# Patient Record
Sex: Female | Born: 1970 | Race: White | Hispanic: No | Marital: Married | Smoking: Never smoker
Health system: Southern US, Community
[De-identification: ages and names within clinical notes are randomized; demographics above are authoritative.]

---

## 2020-05-28 ENCOUNTER — Encounter (HOSPITAL_COMMUNITY): Payer: Self-pay | Admitting: Internal Medicine

## 2020-05-29 ENCOUNTER — Encounter (HOSPITAL_COMMUNITY): Payer: Self-pay | Admitting: Internal Medicine

## 2020-05-29 ENCOUNTER — Encounter (HOSPITAL_COMMUNITY)
Admission: EM | Disposition: A | Discharge status: Home / Self Care | Payer: Self-pay | Source: Other Acute Inpatient Hospital | Attending: Internal Medicine

## 2020-05-29 ENCOUNTER — Other Ambulatory Visit: Payer: Self-pay

## 2020-05-29 ENCOUNTER — Inpatient Hospital Stay (HOSPITAL_COMMUNITY): Payer: BC Managed Care – PPO

## 2020-05-29 ENCOUNTER — Inpatient Hospital Stay (HOSPITAL_COMMUNITY): Payer: BC Managed Care – PPO | Admitting: Anesthesiology

## 2020-05-29 ENCOUNTER — Inpatient Hospital Stay (HOSPITAL_COMMUNITY)
Admission: EM | Admit: 2020-05-29 | Discharge: 2020-05-31 | DRG: 419 | Disposition: A | Discharge status: Home / Self Care | Payer: BC Managed Care – PPO | Source: Other Acute Inpatient Hospital | Attending: Internal Medicine | Admitting: Internal Medicine

## 2020-05-29 DIAGNOSIS — K8050 Calculus of bile duct without cholangitis or cholecystitis without obstruction: Secondary | ICD-10-CM

## 2020-05-29 DIAGNOSIS — Z114 Encounter for screening for human immunodeficiency virus [HIV]: Secondary | ICD-10-CM | POA: Diagnosis not present

## 2020-05-29 DIAGNOSIS — K269 Duodenal ulcer, unspecified as acute or chronic, without hemorrhage or perforation: Secondary | ICD-10-CM | POA: Diagnosis present

## 2020-05-29 DIAGNOSIS — K8070 Calculus of gallbladder and bile duct without cholecystitis without obstruction: Principal | ICD-10-CM | POA: Diagnosis present

## 2020-05-29 DIAGNOSIS — K8021 Calculus of gallbladder without cholecystitis with obstruction: Secondary | ICD-10-CM

## 2020-05-29 DIAGNOSIS — Z20822 Contact with and (suspected) exposure to covid-19: Secondary | ICD-10-CM | POA: Diagnosis present

## 2020-05-29 DIAGNOSIS — R1011 Right upper quadrant pain: Secondary | ICD-10-CM | POA: Diagnosis present

## 2020-05-29 HISTORY — PX: REMOVAL OF STONES: SHX5545

## 2020-05-29 HISTORY — PX: SPHINCTEROTOMY: SHX5544

## 2020-05-29 HISTORY — PX: ERCP: SHX5425

## 2020-05-29 LAB — COMPREHENSIVE METABOLIC PANEL
ALT: UNDETERMINED U/L (ref 0–44)
AST: 81 U/L — ABNORMAL HIGH (ref 15–41)
Albumin: 3.7 g/dL (ref 3.5–5.0)
Alkaline Phosphatase: 122 U/L (ref 38–126)
Anion gap: 15 (ref 5–15)
BUN: 9 mg/dL (ref 6–20)
CO2: 18 mmol/L — ABNORMAL LOW (ref 22–32)
Calcium: 8.9 mg/dL (ref 8.9–10.3)
Chloride: 105 mmol/L (ref 98–111)
Creatinine, Ser: 0.8 mg/dL (ref 0.44–1.00)
GFR calc Af Amer: >60 mL/min (ref >60)
GFR calc non Af Amer: >60 mL/min (ref >60)
Glucose, Bld: 70 mg/dL (ref 70–99)
Potassium: 3.6 mmol/L (ref 3.5–5.1)
Sodium: 138 mmol/L (ref 135–145)
Total Bilirubin: UNDETERMINED mg/dL (ref 0.3–1.2)
Total Protein: 7.4 g/dL (ref 6.5–8.1)

## 2020-05-29 LAB — BILIRUBIN, TOTAL: Total Bilirubin: 6.3 mg/dL — ABNORMAL HIGH (ref 0.3–1.2)

## 2020-05-29 LAB — HEPATIC FUNCTION PANEL
ALT: 243 U/L — ABNORMAL HIGH (ref 0–44)
AST: 71 U/L — ABNORMAL HIGH (ref 15–41)
Albumin: 3.5 g/dL (ref 3.5–5.0)
Alkaline Phosphatase: 114 U/L (ref 38–126)
Bilirubin, Direct: 3.9 mg/dL — ABNORMAL HIGH (ref 0.0–0.2)
Indirect Bilirubin: 2.1 mg/dL — ABNORMAL HIGH (ref 0.3–0.9)
Total Bilirubin: 6 mg/dL — ABNORMAL HIGH (ref 0.3–1.2)
Total Protein: 6.8 g/dL (ref 6.5–8.1)

## 2020-05-29 LAB — CBC
HCT: 41.5 % (ref 36.0–46.0)
Hemoglobin: 13.4 g/dL (ref 12.0–15.0)
MCH: 29.5 pg (ref 26.0–34.0)
MCHC: 32.3 g/dL (ref 30.0–36.0)
MCV: 91.2 fL (ref 80.0–100.0)
Platelets: 164 10*3/uL (ref 150–400)
RBC: 4.55 MIL/uL (ref 3.87–5.11)
RDW: 13.2 % (ref 11.5–15.5)
WBC: 6.3 10*3/uL (ref 4.0–10.5)
nRBC: 0 % (ref 0.0–0.2)

## 2020-05-29 LAB — HIV ANTIBODY (ROUTINE TESTING W REFLEX): HIV Screen 4th Generation wRfx: NONREACTIVE

## 2020-05-29 LAB — ALT: ALT: 243 U/L — ABNORMAL HIGH (ref 0–44)

## 2020-05-29 LAB — SURGICAL PCR SCREEN
MRSA, PCR: NEGATIVE
Staphylococcus aureus: NEGATIVE

## 2020-05-29 LAB — LACTIC ACID, PLASMA: Lactic Acid, Venous: 1 mmol/L (ref 0.5–1.9)

## 2020-05-29 LAB — SARS CORONAVIRUS 2 BY RT PCR (HOSPITAL ORDER, PERFORMED IN ~~LOC~~ HOSPITAL LAB): SARS Coronavirus 2: NEGATIVE

## 2020-05-29 SURGERY — ERCP, WITH INTERVENTION IF INDICATED
Anesthesia: General

## 2020-05-29 MED ORDER — LIDOCAINE 2% (20 MG/ML) 5 ML SYRINGE
INTRAMUSCULAR | Status: DC | PRN
  Administered 2020-05-29: 80 mg via INTRAVENOUS

## 2020-05-29 MED ORDER — ACETAMINOPHEN 650 MG RE SUPP: 650.0000 mg | Freq: Four times a day (QID) | RECTAL | Status: DC | PRN

## 2020-05-29 MED ORDER — SODIUM CHLORIDE 0.9 % IV SOLN: INTRAVENOUS | Status: DC

## 2020-05-29 MED ORDER — ROCURONIUM BROMIDE 10 MG/ML (PF) SYRINGE
PREFILLED_SYRINGE | INTRAVENOUS | Status: DC | PRN
  Administered 2020-05-29: 50 mg via INTRAVENOUS

## 2020-05-29 MED ORDER — METRONIDAZOLE IN NACL 5-0.79 MG/ML-% IV SOLN
500.0000 mg | Freq: Three times a day (TID) | INTRAVENOUS | Status: DC
  Administered 2020-05-29 – 2020-05-30 (×5): 500 mg via INTRAVENOUS
  Filled 2020-05-29 (×5): qty 100

## 2020-05-29 MED ORDER — MORPHINE SULFATE (PF) 2 MG/ML IV SOLN
1.0000 mg | INTRAVENOUS | Status: DC | PRN
  Administered 2020-05-29 (×3): 1 mg via INTRAVENOUS
  Filled 2020-05-29 (×3): qty 1

## 2020-05-29 MED ORDER — INDOMETHACIN 50 MG RE SUPP
RECTAL | Status: AC
  Filled 2020-05-29: qty 2

## 2020-05-29 MED ORDER — ONDANSETRON HCL 4 MG/2ML IJ SOLN: 4.0000 mg | Freq: Four times a day (QID) | INTRAMUSCULAR | Status: DC | PRN

## 2020-05-29 MED ORDER — CIPROFLOXACIN IN D5W 400 MG/200ML IV SOLN
INTRAVENOUS | Status: AC
  Filled 2020-05-29: qty 200

## 2020-05-29 MED ORDER — SUGAMMADEX SODIUM 200 MG/2ML IV SOLN
INTRAVENOUS | Status: DC | PRN
  Administered 2020-05-29: 200 mg via INTRAVENOUS

## 2020-05-29 MED ORDER — PANTOPRAZOLE SODIUM 40 MG IV SOLR
40.0000 mg | Freq: Two times a day (BID) | INTRAVENOUS | Status: DC
  Administered 2020-05-29 – 2020-05-30 (×2): 40 mg via INTRAVENOUS
  Filled 2020-05-29 (×2): qty 40

## 2020-05-29 MED ORDER — FENTANYL CITRATE (PF) 250 MCG/5ML IJ SOLN
INTRAMUSCULAR | Status: DC | PRN
  Administered 2020-05-29: 100 ug via INTRAVENOUS

## 2020-05-29 MED ORDER — SODIUM CHLORIDE 0.9 % IV SOLN
2.0000 g | INTRAVENOUS | Status: DC
  Administered 2020-05-29 – 2020-05-30 (×2): 2 g via INTRAVENOUS
  Filled 2020-05-29: qty 2
  Filled 2020-05-29: qty 20
  Filled 2020-05-29: qty 2
  Filled 2020-05-29: qty 20

## 2020-05-29 MED ORDER — DEXAMETHASONE SODIUM PHOSPHATE 10 MG/ML IJ SOLN
INTRAMUSCULAR | Status: DC | PRN
  Administered 2020-05-29: 5 mg via INTRAVENOUS

## 2020-05-29 MED ORDER — PROPOFOL 10 MG/ML IV BOLUS
INTRAVENOUS | Status: DC | PRN
  Administered 2020-05-29: 140 mg via INTRAVENOUS

## 2020-05-29 MED ORDER — SODIUM CHLORIDE 0.9 % IV SOLN
INTRAVENOUS | Status: DC | PRN
  Administered 2020-05-29: 25 mL

## 2020-05-29 MED ORDER — GLUCAGON HCL RDNA (DIAGNOSTIC) 1 MG IJ SOLR
INTRAMUSCULAR | Status: AC
  Filled 2020-05-29: qty 1

## 2020-05-29 MED ORDER — MIDAZOLAM HCL 5 MG/5ML IJ SOLN
INTRAMUSCULAR | Status: DC | PRN
  Administered 2020-05-29: 2 mg via INTRAVENOUS

## 2020-05-29 MED ORDER — ONDANSETRON HCL 4 MG/2ML IJ SOLN
INTRAMUSCULAR | Status: DC | PRN
  Administered 2020-05-29: 4 mg via INTRAVENOUS

## 2020-05-29 MED ORDER — INDOMETHACIN 50 MG RE SUPP
RECTAL | Status: DC | PRN
  Administered 2020-05-29: 100 mg via RECTAL

## 2020-05-29 MED ORDER — ACETAMINOPHEN 325 MG PO TABS
650.0000 mg | ORAL_TABLET | Freq: Four times a day (QID) | ORAL | Status: DC | PRN
  Administered 2020-05-30: 650 mg via ORAL
  Filled 2020-05-29: qty 2

## 2020-05-29 MED ORDER — SODIUM CHLORIDE 0.9 % IV SOLN: INTRAVENOUS | Status: AC

## 2020-05-29 MED ORDER — LACTATED RINGERS IV SOLN: INTRAVENOUS | Status: DC | PRN

## 2020-05-29 NOTE — Brief Op Note (Signed)
05/29/2020  3:29 PM  PATIENT:  Kylie Moyer  49 y.o. female  PRE-OPERATIVE DIAGNOSIS:  CBD sludge, dilated CBD, abnormal LFTs  POST-OPERATIVE DIAGNOSIS:  sphincterotomy, balloon extraction of sludge  PROCEDURE:  Procedure(s): ENDOSCOPIC RETROGRADE CHOLANGIOPANCREATOGRAPHY (ERCP) (N/A) REMOVAL OF STONES SPHINCTEROTOMY  SURGEON:  Surgeon(s) and Role:    Ronnette Juniper, MD - Primary  PHYSICIAN ASSISTANT:   ASSISTANTS: Vista Lawman, RN, Lazaro Arms, Tech  ANESTHESIA:   MAC  EBL:  Minimal  BLOOD ADMINISTERED:none  DRAINS: none  LOCAL MEDICATIONS USED:  NONE  SPECIMEN:  No Specimen  DISPOSITION OF SPECIMEN:  N/A  COUNTS:  YES  TOURNIQUET:  * No tourniquets in log *  DICTATION: .Dragon Dictation  PLAN OF CARE: Admit to inpatient   PATIENT DISPOSITION:  PACU - hemodynamically stable.   Delay start of Pharmacological VTE agent (>24hrs) due to surgical blood loss or risk of bleeding: not applicable

## 2020-05-29 NOTE — Progress Notes (Signed)
Same day note  Patient seen and examined at bedside.  Patient was admitted to the hospital for abdominal pain.  At the time of my evaluation, patient complains of right upper quadrant pain  Physical examination reveals tenderness over the right upper quadrant  Laboratory data and imaging was reviewed  Assessment and Plan.  Abdominal pain secondary to cholelithiasis//with possible choledocholithiasis elevated LFTs and CBD dilatation.  Patient is currently NPO.  Marland Kitchen  Patient will likely need surgical intervention plus ERCP. spoke with GI Dr. Marca Ancona ,will likely go for ERCP tonight.  Spoke with surgery on-call as well.  Tentative cholecystectomy in a.m.  I also spoke with the patient's husband at bedside.  No Charge  Signed,  Tenny Craw, MD [facility]

## 2020-05-29 NOTE — Op Note (Signed)
[facility] Patient Name: Kylie Moyer Procedure Date : 05/29/2020 MRN: [id number] Attending MD: Kerin Salen , MD Date of Birth: 10-Apr-1971 CSN: [id number] Age: 49 Admit Type: Inpatient Procedure:                ERCP Indications:              CBD dilation, sludge in CBD/Cystic duct/gall                            bladder and elevated liver enzymes Providers:                Kerin Salen, MD, Roselie Awkward, RN, Marc Morgans,                            Technician Referring MD:             [facility] Medicines:                Monitored Anesthesia Care Complications:            No immediate complications. Estimated blood loss:                            Minimal. Estimated Blood Loss:     Estimated blood loss was minimal. Procedure:                Pre-Anesthesia Assessment:                           - Prior to the procedure, a History and Physical                            was performed, and patient medications and                            allergies were reviewed. The patient's tolerance of                            previous anesthesia was also reviewed. The risks                            and benefits of the procedure and the sedation                            options and risks were discussed with the patient.                            All questions were answered, and informed consent                            was obtained. Prior Anticoagulants: The patient has                            taken no previous anticoagulant or antiplatelet                            agents. ASA Grade  Assessment: II - A patient with                            mild systemic disease. After reviewing the risks                            and benefits, the patient was deemed in                            satisfactory condition to undergo the procedure.                           After obtaining informed consent, the scope was                            passed under direct vision. Throughout  the                            procedure, the patient's blood pressure, pulse, and                            oxygen saturations were monitored continuously. The                            [id number]-Q180V ([id number]) Olympus Duodensocope was                            introduced through the mouth, and used to inject                            contrast into and used to inject contrast into the                            bile duct. The ERCP was accomplished without                            difficulty. The patient tolerated the procedure                            well. Scope In: Scope Out: Findings:      The scout film was normal. The esophagus was successfully intubated       under direct vision. The scope was advanced to a normal major papilla in       the descending duodenum without detailed examination of the pharynx,       larynx and associated structures, and upper GI tract.      There were many superficial ulcers with pigmentation noted in the       duodenal bulb and duodenum.      Canulation of the bile duct was challenging and the wire advanced into       the pancreatic duct twice. So it was left in the pancreatic duct and the       CBD was canulated using double wire technique which required extreme       bowing of the sphinctertome.  The bile duct was deeply cannulated with the sphincterotome. Contrast       was injected. I personally interpreted the bile duct images. There was       brisk flow of contrast through the ducts. Image quality was excellent.       Contrast extended to the main bile duct. The main bile duct was dilated       but there was no filling defect noted. A straight Roadrunner wire was       passed into the biliary tree.      The wire in the pancreatic duct was then removed.      A 10 mm biliary sphincterotomy was made with a braided sphincterotome       using ERBE electrocautery. There was no post-sphincterotomy bleeding.      The biliary tree was swept with  a 9 and 12 mm balloon starting at the       bifurcation. A large amount of thick sludge was swept from the duct.      The sphincterotomy was further extended by another 1-2 mm. An attempt       was made to repass the 9-12 mm balloon, but due to severe angulation, it       would not pass into the CBD.      Cholangiogram did not show any filling defects at the end of the       procedure.      The patient was given rectal indomethacin prior to start of the       procedure. Impression:               - A biliary sphincterotomy was performed.                           - The biliary tree was swept and sludge was found.                           - Multiple duodenal ulcers(superficial, with                            pigmentation). Moderate Sedation:      Patient did not receive moderate sedation for this procedure, but       instead received monitored anesthesia care. Recommendation:           - Clear liquid diet.                           - Possible cholecystectomy tomorrow(as per surgical                            team). Procedure Code(s):        --- Professional ---                           [phone], Endoscopic retrograde                            cholangiopancreatography (ERCP); with removal of                            calculi/debris from biliary/pancreatic duct(s)  40086, Endoscopic retrograde                            cholangiopancreatography (ERCP); with                            sphincterotomy/papillotomy Diagnosis Code(s):        --- Professional ---                           R74.8, Abnormal levels of other serum enzymes CPT copyright 2019 [employer]. All rights reserved. The codes documented in this report are preliminary and upon coder review may  be revised to meet current compliance requirements. Kerin Salen, MD 05/29/2020 3:28:29 PM This report has been signed electronically. Number of Addenda: 0

## 2020-05-29 NOTE — Anesthesia Preprocedure Evaluation (Addendum)
Anesthesia Evaluation  Patient identified by MRN, date of birth, ID band Patient awake    Reviewed: Allergy & Precautions, NPO status , Patient's Chart, lab work & pertinent test results  History of Anesthesia Complications Negative for: history of anesthetic complications  Airway Mallampati: II  TM Distance: >3 FB Neck ROM: Full    Dental  (+) Missing,    Pulmonary neg pulmonary ROS,    Pulmonary exam normal        Cardiovascular negative cardio ROS Normal cardiovascular exam     Neuro/Psych negative neurological ROS  negative psych ROS   GI/Hepatic Ast 71, ALT 243 Choledocholithiasis   Endo/Other  negative endocrine ROS  Renal/GU negative Renal ROS  negative genitourinary   Musculoskeletal negative musculoskeletal ROS (+)   Abdominal   Peds  Hematology negative hematology ROS (+)   Anesthesia Other Findings Day of surgery medications reviewed with patient.  Reproductive/Obstetrics negative OB ROS                            Anesthesia Physical Anesthesia Plan  ASA: III  Anesthesia Plan: General   Post-op Pain Management:    Induction: Intravenous  PONV Risk Score and Plan: 3 and Treatment may vary due to age or medical condition, Ondansetron, Dexamethasone and Midazolam  Airway Management Planned: Oral ETT  Additional Equipment: None  Intra-op Plan:   Post-operative Plan: Extubation in OR  Informed Consent: I have reviewed the patients History and Physical, chart, labs and discussed the procedure including the risks, benefits and alternatives for the proposed anesthesia with the patient or authorized representative who has indicated his/her understanding and acceptance.     Dental advisory given  Plan Discussed with:   Anesthesia Plan Comments:        Anesthesia Quick Evaluation

## 2020-05-29 NOTE — Progress Notes (Signed)
   05/29/20 0258  Vitals  Temp 98.3 F (36.8 C)  Temp Source Oral  BP (!) 152/78  MAP (mmHg) 100  BP Location Left Arm  BP Method Automatic  Patient Position (if appropriate) Lying  Pulse Rate 99  Pulse Rate Source Dinamap  Resp 18  Level of Consciousness  Level of Consciousness Alert  Oxygen Therapy  SpO2 100 %  O2 Device Room Air  Pain Assessment  Pain Scale 0-10  Pain Score 6  Pain Type Acute pain  Pain Location Abdomen  Pain Orientation Right  Pain Descriptors / Indicators Aching  Pain Frequency Constant  Pain Onset On-going  Patients Stated Pain Goal 2  Pain Intervention(s) MD notified (Comment)  MEWS Score  MEWS Temp 0  MEWS Systolic 0  MEWS Pulse 0  MEWS RR 0  MEWS LOC 0  MEWS Score 0  MEWS Score Color Green  Received patient alert oriented x4 with skin intact and ambulatory with vital signs stable shown above. Patient oriented to room and bed controls, situated in bed with call bell at reach. Will continue to monitor patient with remainder of shift.

## 2020-05-29 NOTE — H&P (Signed)
History and Physical    Kylie Moyer LNL:[id number] DOB: 1971-03-29 DOA: 05/29/2020  PCP: No primary care provider on file. Patient coming from: [facility]  Chief Complaint: Abdominal pain  HPI: Kylie Moyer is a 49 y.o. female with no significant past medical history presented to [facility] ED on 6/15 with complaints of epigastric and right upper quadrant abdominal pain, nausea x 4 days.  Afebrile. Tachycardic. Not hypotensive. No leukocytosis. Lactic acid normal. AST 105, ALT 363, alk phos 167, and T bili 5.7. Lipase 44. CT abdomen revealed evidence of sludge within the gallbladder, cystic duct, and common bile duct with associated CBD dilation.  Also showing evidence of cholelithiasis. Patient was given Zosyn. COVID swab negative.   Patient reports 4-day history of severe cramping pain across her upper abdomen.  Denies associated nausea or vomiting.  Denies diarrhea.  Denies fevers or chills.  Denies history of gallstones.  She has had 2 prior C-sections but does not have any medical problems and currently not on any medications.  No other complaints.  Denies cough, shortness of breath, or chest pain.  Review of Systems:  All systems reviewed and apart from history of presenting illness, are negative.  History reviewed. No pertinent past medical history.  Past Surgical History:  Procedure Laterality Date  . CESAREAN SECTION     x2     reports that she has never smoked. She has never used smokeless tobacco. She reports that she does not drink alcohol and does not use drugs.  Not on File  History reviewed. No pertinent family history.  Prior to Admission medications   Not on File    Physical Exam: Vitals:   05/29/20 0258  BP: (!) 152/78  Pulse: 99  Resp: 18  Temp: 98.3 F (36.8 C)  TempSrc: Oral  SpO2: 100%    Physical Exam Constitutional:      General: She is not in acute distress. HENT:     Head: Normocephalic.     Mouth/Throat:     Mouth: Mucous  membranes are dry.  Eyes:     Extraocular Movements: Extraocular movements intact.  Cardiovascular:     Rate and Rhythm: Normal rate and regular rhythm.     Pulses: Normal pulses.  Pulmonary:     Effort: Pulmonary effort is normal. No respiratory distress.     Breath sounds: Normal breath sounds. No wheezing or rales.  Abdominal:     General: Bowel sounds are normal. There is no distension.     Palpations: Abdomen is soft.     Tenderness: There is no abdominal tenderness. There is no guarding or rebound.  Musculoskeletal:        General: No swelling.     Cervical back: Normal range of motion and neck supple.  Skin:    General: Skin is warm and dry.  Neurological:     Mental Status: She is alert and oriented to person, place, and time.     Labs on Admission: I have personally reviewed following labs and imaging studies  CBC: No results for input(s): WBC, NEUTROABS, HGB, HCT, MCV, PLT in the last 168 hours. Basic Metabolic Panel: No results for input(s): NA, K, CL, CO2, GLUCOSE, BUN, CREATININE, CALCIUM, MG, PHOS in the last 168 hours. GFR: CrCl cannot be calculated (No successful lab value found.). Liver Function Tests: No results for input(s): AST, ALT, ALKPHOS, BILITOT, PROT, ALBUMIN in the last 168 hours. No results for input(s): LIPASE, AMYLASE in the last 168 hours. No results  for input(s): AMMONIA in the last 168 hours. Coagulation Profile: No results for input(s): INR, PROTIME in the last 168 hours. Cardiac Enzymes: No results for input(s): CKTOTAL, CKMB, CKMBINDEX, TROPONINI in the last 168 hours. BNP (last 3 results) No results for input(s): PROBNP in the last 8760 hours. HbA1C: No results for input(s): HGBA1C in the last 72 hours. CBG: No results for input(s): GLUCAP in the last 168 hours. Lipid Profile: No results for input(s): CHOL, HDL, LDLCALC, TRIG, CHOLHDL, LDLDIRECT in the last 72 hours. Thyroid Function Tests: No results for input(s): TSH, T4TOTAL,  FREET4, T3FREE, THYROIDAB in the last 72 hours. Anemia Panel: No results for input(s): VITAMINB12, FOLATE, FERRITIN, TIBC, IRON, RETICCTPCT in the last 72 hours. Urine analysis: No results found for: COLORURINE, APPEARANCEUR, LABSPEC, PHURINE, GLUCOSEU, HGBUR, BILIRUBINUR, KETONESUR, PROTEINUR, UROBILINOGEN, NITRITE, LEUKOCYTESUR  Radiological Exams on Admission: No results found.  Assessment/Plan Principal Problem:   Choledocholithiasis Active Problems:   Encounter for screening for HIV   Choledocholithiasis: Currently afebrile and not tachycardic.  Labs done at outside hospital showing no leukocytosis and normal lactic acid. AST 105, ALT 363, alk phos 167, and T bili 5.7. Lipase 44. CT abdomen revealed evidence of sludge within the gallbladder, cystic duct, and common bile duct with associated CBD dilation.   -Ceftriaxone and Flagyl.  Order blood cultures.  Morphine as needed for pain, antiemetic as needed.  IV fluid hydration and keep n.p.o. Right upper quadrant ultrasound.  Consult GI in a.m. for ERCP.  HIV screening -The patient falls between the ages of 13-64 and should be screened for HIV, therefore HIV testing ordered.  DVT prophylaxis: SCDs at this time Code Status: Full code Family Communication: Husband at bedside. Disposition Plan: Status is: Inpatient  Remains inpatient appropriate because:Ongoing diagnostic testing needed not appropriate for outpatient work up and IV treatments appropriate due to intensity of illness or inability to take PO   Dispo: The patient is from: Home              Anticipated d/c is to: Home              Anticipated d/c date is: 3 days              Patient currently is not medically stable to d/c.  The medical decision making on this patient was of high complexity and the patient is at high risk for clinical deterioration, therefore this is a level 3 visit.  Shela Leff MD [facility]  If 7PM-7AM, please contact  night-coverage [email or web address]  05/29/2020, 4:24 AM

## 2020-05-29 NOTE — Consult Note (Signed)
[facility] Consult  Referring Provider: [facility] Primary Care Physician:  Patient, No Pcp Per Primary Gastroenterologist: Althia Forts  Reason for Consultation: Abnormal LFTs, possible choledocholithiasis  HPI: Kylie Moyer is a 49 y.o. female was [facility] when she presented with epigastric right and left upper quadrant abdominal pain. Patient states she was in her usual state of health until Saturday evening(5 days ago) when she experienced upper abdominal pain both on the right as well as left side which got better on its own.  Her symptoms recurred on Sunday, Monday and on Tuesday it became more severe which prompted her to go to the ED. Patient denies vomiting or fever.  On arrival to the ED she had a CAT scan which showed milk of calcium bile or layering sludge in the fundus, cystic duct and mid and distal common duct to the level of the ampulla. The common duct is mildly prominent.  She has been treated with IV fluids, IV Zosyn, labs from today show a total bilirubin of 6.3, elevated ALT of 243. On presentation to the ED her total bilirubin was 5.7 and ALT was 363, AST was 105 and ALP was 167. Lipase was normal at 44.  Currently patient does not have any abdominal pain but she has also not had anything to eat or drink for the last 2 to 3 days.   History reviewed. No pertinent past medical history.  Past Surgical History:  Procedure Laterality Date  . CESAREAN SECTION     x2    Prior to Admission medications   Not on File    Current Facility-Administered Medications  Medication Dose Route Frequency Provider Last Rate Last Admin  . 0.9 %  sodium chloride infusion   Intravenous Continuous Shela Leff, MD 125 mL/hr at 05/29/20 0700 Rate Verify at 05/29/20 0700  . acetaminophen (TYLENOL) tablet 650 mg  650 mg Oral Q6H PRN Shela Leff, MD       Or  . acetaminophen (TYLENOL) suppository 650 mg  650 mg Rectal Q6H PRN  Shela Leff, MD      . cefTRIAXone (ROCEPHIN) 2 g in sodium chloride 0.9 % 100 mL IVPB  2 g Intravenous Q24H Shela Leff, MD   Stopped at 05/29/20 0556  . metroNIDAZOLE (FLAGYL) IVPB 500 mg  500 mg Intravenous Q8H Shela Leff, MD 100 mL/hr at 05/29/20 0630 500 mg at 05/29/20 0630  . morphine 2 MG/ML injection 1 mg  1 mg Intravenous Q3H PRN Shela Leff, MD   1 mg at 05/29/20 0748  . ondansetron (ZOFRAN) injection 4 mg  4 mg Intravenous Q6H PRN Shela Leff, MD        Allergies as of 05/28/2020  . (Not on File)    History reviewed. No pertinent family history.  Social History   Socioeconomic History  . Marital status: Married    Spouse name: Not on file  . Number of children: Not on file  . Years of education: Not on file  . Highest education level: Not on file  Occupational History  . Not on file  Tobacco Use  . Smoking status: Never Smoker  . Smokeless tobacco: Never Used  Substance and Sexual Activity  . Alcohol use: Never  . Drug use: Never  . Sexual activity: Not on file  Other Topics Concern  . Not on file  Social History Narrative  . Not on file   Social Determinants of Health   Financial Resource Strain:   . Difficulty of Paying  Living Expenses:   Food Insecurity:   . Worried About Programme researcher, broadcasting/film/video in the Last Year:   . Barista in the Last Year:   Transportation Needs:   . Freight forwarder (Medical):   Marland Kitchen Lack of Transportation (Non-Medical):   Physical Activity:   . Days of Exercise per Week:   . Minutes of Exercise per Session:   Stress:   . Feeling of Stress :   Social Connections:   . Frequency of Communication with Friends and Family:   . Frequency of Social Gatherings with Friends and Family:   . Attends Religious Services:   . Active Member of Clubs or Organizations:   . Attends Banker Meetings:   Marland Kitchen Marital Status:   Intimate Partner Violence:   . Fear of Current or Ex-Partner:   .  Emotionally Abused:   Marland Kitchen Physically Abused:   . Sexually Abused:     Review of Systems:  GI: Described in detail in HPI.    Gen: Denies any fever, chills, rigors, night sweats, anorexia, fatigue, weakness, malaise, involuntary weight loss, and sleep disorder CV: Denies chest pain, angina, palpitations, syncope, orthopnea, PND, peripheral edema, and claudication. Resp: Denies dyspnea, cough, sputum, wheezing, coughing up blood. GU : Denies urinary burning, blood in urine, urinary frequency, urinary hesitancy, nocturnal urination, and urinary incontinence. MS: Denies joint pain or swelling.  Denies muscle weakness, cramps, atrophy.  Derm: Denies rash, itching, oral ulcerations, hives, unhealing ulcers.  Psych: Denies depression, anxiety, memory loss, suicidal ideation, hallucinations,  and confusion. Heme: Denies bruising, bleeding, and enlarged lymph nodes. Neuro:  Denies any headaches, dizziness, paresthesias. Endo:  Denies any problems with DM, thyroid, adrenal function.  Physical Exam: Vital signs in last 24 hours: Temp:  [98.1 F (36.7 C)-98.3 F (36.8 C)] 98.1 F (36.7 C) (06/16 0519) Pulse Rate:  [73-99] 73 (06/16 0519) Resp:  [14-18] 14 (06/16 0519) BP: (130-152)/(72-78) 130/72 (06/16 0519) SpO2:  [98 %-100 %] 98 % (06/16 0519) Weight:  [69.3 kg] 69.3 kg (06/16 0534) Last BM Date: 05/28/20  General:   Alert,  Well-developed, well-nourished, pleasant and cooperative in NAD Head:  Normocephalic and atraumatic. Eyes:  Icteric   conjunctiva pink. Ears:  Normal auditory acuity. Nose:  No deformity, discharge,  or lesions. Mouth:  No deformity or lesions.  Oropharynx pink & moist. Neck:  Supple; no masses or thyromegaly. Lungs:  Clear throughout to auscultation.   No wheezes, crackles, or rhonchi. No acute distress. Heart:  Regular rate and rhythm; no murmurs, clicks, rubs,  or gallops. Extremities:  Without clubbing or edema. Neurologic:  Alert and  oriented x4;  grossly  normal neurologically. Skin:  Intact without significant lesions or rashes. Psych:  Alert and cooperative. Normal mood and affect. Abdomen:  Soft, right upper quadrant tenderness, normoactive bowel sounds, no rigidity/guarding/rebound tenderness     Lab Results: Recent Labs    05/29/20 0356  WBC 6.3  HGB 13.4  HCT 41.5  PLT 164   BMET Recent Labs    05/29/20 0356  NA 138  K 3.6  CL 105  CO2 18*  GLUCOSE 70  BUN 9  CREATININE 0.80  CALCIUM 8.9   LFT Recent Labs    05/29/20 0356 05/29/20 0356 05/29/20 0835  PROT 7.4  --   --   ALBUMIN 3.7  --   --   AST 81*  --   --   ALT QUANTITY NOT SUFFICIENT, UNABLE TO PERFORM TEST   < >  243*  ALKPHOS 122  --   --   BILITOT QUANTITY NOT SUFFICIENT, UNABLE TO PERFORM TEST   < > 6.3*   < > = values in this interval not displayed.   PT/INR No results for input(s): LABPROT, INR in the last 72 hours.  Studies/Results: US Abdomen Limited RUQ  Result Date: 05/29/2020 CLINICAL DATA:  Abdominal pain, question of choledocholithiasis EXAM: ULTRASOUND ABDOMEN LIMITED RIGHT UPPER QUADRANT COMPARISON:  None. FINDINGS: Gallbladder: Layering hyperechoic sludge/small stones is seen. No sonographic Murphy sign noted by sonographer. Common bile duct: Diameter: 7 mm Liver: No focal lesion identified. Within normal limits in parenchymal echogenicity. Portal vein is patent on color Doppler imaging with normal direction of blood flow towards the liver. Other: None. IMPRESSION: Cholelithiasis/sludge without evidence of acute cholecystitis. Electronically Signed   By: Jonna Clark M.D.   On: 05/29/2020 05:20    Impression: Gallbladder and CBD sludge with prominent CBD noted on CT Ultrasound showed a CBD of 7 mm and layering sludge/small stones Patient's bilirubin elevated along with ALT  Plan: ERCP today. Keep patient n.p.o., continue normal saline at 125 mL/h, patient started on IV ceftriaxone and IV Flagyl and IV morphine for pain control. The  risks(pancreatitis, bleeding, perforation, anesthesia complication) and the benefits of the procedure were discussed with the patient in details. She understands and verbalizes consent.  Surgical consult for cholecystectomy.   LOS: 0 days   Kerin Salen, MD  05/29/2020, 9:39 AM

## 2020-05-29 NOTE — Anesthesia Procedure Notes (Signed)
Procedure Name: Intubation Date/Time: 05/29/2020 2:12 PM Performed by: Kyung Rudd, CRNA Pre-anesthesia Checklist: Patient identified, Emergency Drugs available, Suction available and Patient being monitored Patient Re-evaluated:Patient Re-evaluated prior to induction Oxygen Delivery Method: Circle system utilized Preoxygenation: Pre-oxygenation with 100% oxygen Induction Type: IV induction Ventilation: Mask ventilation without difficulty Laryngoscope Size: Mac and 3 Grade View: Grade I Tube type: Oral Tube size: 7.0 mm Number of attempts: 1 Airway Equipment and Method: Stylet Placement Confirmation: ETT inserted through vocal cords under direct vision,  positive ETCO2 and breath sounds checked- equal and bilateral Secured at: 20 cm Tube secured with: Tape Dental Injury: Teeth and Oropharynx as per pre-operative assessment

## 2020-05-29 NOTE — Anesthesia Postprocedure Evaluation (Signed)
Anesthesia Post Note  Patient: Lumen Brinlee  Procedure(s) Performed: ENDOSCOPIC RETROGRADE CHOLANGIOPANCREATOGRAPHY (ERCP) (N/A ) REMOVAL OF STONES SPHINCTEROTOMY     Patient location during evaluation: PACU Anesthesia Type: General Level of consciousness: awake and alert and oriented Pain management: pain level controlled Vital Signs Assessment: post-procedure vital signs reviewed and stable Respiratory status: spontaneous breathing, nonlabored ventilation and respiratory function stable Cardiovascular status: blood pressure returned to baseline Postop Assessment: no apparent nausea or vomiting Anesthetic complications: no   No complications documented.  Last Vitals:  Vitals:   05/29/20 1545 05/29/20 1555  BP: (!) 125/59 132/68  Pulse: 86 95  Resp: 12 15  Temp:    SpO2: 97% 97%    Last Pain:  Vitals:   05/29/20 1555  TempSrc:   PainSc: 0-No pain                 Kaylyn Layer

## 2020-05-29 NOTE — Transfer of Care (Signed)
Immediate Anesthesia Transfer of Care Note  Patient: Kylie Moyer  Procedure(s) Performed: ENDOSCOPIC RETROGRADE CHOLANGIOPANCREATOGRAPHY (ERCP) (N/A ) REMOVAL OF STONES SPHINCTEROTOMY  Patient Location: Endoscopy Unit  Anesthesia Type:General  Level of Consciousness: awake, alert  and oriented  Airway & Oxygen Therapy: Patient Spontanous Breathing  Post-op Assessment: Report given to RN, Post -op Vital signs reviewed and stable and Patient moving all extremities X 4  Post vital signs: Reviewed and stable  Last Vitals:  Vitals Value Taken Time  BP 124/69 05/29/20 1535  Temp 36.9 C 05/29/20 1535  Pulse 100 05/29/20 1543  Resp 17 05/29/20 1543  SpO2 97 % 05/29/20 1543  Vitals shown include unvalidated device data.  Last Pain:  Vitals:   05/29/20 1535  TempSrc: Axillary  PainSc: 0-No pain      Patients Stated Pain Goal: 2 (05/29/20 0745)  Complications: No complications documented.

## 2020-05-29 NOTE — Consult Note (Signed)
[facility] Surgery Consult Note  Kylie Moyer 1971/05/22  [id number].    Requesting MD: Flora Lipps Chief Complaint/Reason for Consult: choledocholithiasis HPI:  Patient is an otherwise healthy 49 year old female who was transferred to [facility] from [facility] ED in [place] with 5 day hx of upper abdominal pain. Pain has been intermittent since onset but has progressively worsened with each recurrence. She denies fever, chills, chest pain, SOB, nausea, vomiting, diarrhea. She did have a lighter colored stool yesterday and some dark urine. Workup revealed choledocholithiasis. GI is planning on ERCP today. Past abdominal surgery includes 2 cesarean sections. She does not take any blood thinning medications. She denies alcohol, current tobacco, or illicit drug use. She is not currently working outside of her home. Her husband was at the bedside.   ROS: Review of Systems  Constitutional: Negative for chills and fever.  Respiratory: Negative for shortness of breath.   Cardiovascular: Negative for chest pain and palpitations.  Gastrointestinal: Positive for abdominal pain. Negative for blood in stool, constipation, diarrhea, heartburn, melena, nausea and vomiting.       Light colored stool   Genitourinary: Negative for dysuria, frequency and urgency.       Dark urine   Skin: Negative for itching.  All other systems reviewed and are negative.   History reviewed. No pertinent family history.  History reviewed. No pertinent past medical history.  Past Surgical History:  Procedure Laterality Date  . CESAREAN SECTION     x2    Social History:  reports that she has never smoked. She has never used smokeless tobacco. She reports that she does not drink alcohol and does not use drugs.  Allergies: No Known Allergies  No medications prior to admission.    Blood pressure 130/72, pulse 73, temperature 98.1 F (36.7 C), temperature source Oral, resp. rate 14, height 5' (1.524 m), weight 69.3 kg,  SpO2 98 %. Physical Exam:  General: pleasant, WD, WN white female who is laying in bed in NAD HEENT: Sclera are anicteric.  PERRL.  Ears and nose without any masses or lesions.  Mouth is pink and moist Heart: regular, rate, and rhythm.  Normal s1,s2. No obvious murmurs, gallops, or rubs noted.  Palpable radial and pedal pulses bilaterally Lungs: CTAB, no wheezes, rhonchi, or rales noted.  Respiratory effort nonlabored Abd: soft, ttp in epigastrium and RUQ, ND, +BS, no masses, hernias, or organomegaly MS: all 4 extremities are symmetrical with no cyanosis, clubbing, or edema. Skin: warm and dry with no masses, lesions, or rashes Neuro: Cranial nerves 2-12 grossly intact, sensation grossly intact throughout Psych: A&Ox3 with an appropriate affect.   Results for orders placed or performed during the hospital encounter of 05/29/20 (from the past 48 hour(s))  Surgical pcr screen     Status: None   Collection Time: 05/29/20  3:36 AM   Specimen: Nasal Mucosa; Nasal Swab  Result Value Ref Range   MRSA, PCR NEGATIVE NEGATIVE   Staphylococcus aureus NEGATIVE NEGATIVE    Comment: (NOTE) The Xpert SA Assay (FDA approved for NASAL specimens in patients 65 years of age and older), is one component of a comprehensive surveillance program. It is not intended to diagnose infection nor to guide or monitor treatment. Performed at [facility], [place] [address], [place], [place] [address]   CBC     Status: None   Collection Time: 05/29/20  3:56 AM  Result Value Ref Range   WBC 6.3 4.0 - 10.5 K/uL   RBC  4.55 3.87 - 5.11 MIL/uL   Hemoglobin 13.4 12.0 - 15.0 g/dL   HCT 46.9 36 - 46 %   MCV 91.2 80.0 - 100.0 fL   MCH 29.5 26.0 - 34.0 pg   MCHC 32.3 30.0 - 36.0 g/dL   RDW 62.9 52.8 - 41.3 %   Platelets 164 150 - 400 K/uL   nRBC 0.0 0.0 - 0.2 %    Comment: Performed at [facility], [address] [place], [place] [address]  Comprehensive metabolic panel     Status: Abnormal   Collection  Time: 05/29/20  3:56 AM  Result Value Ref Range   Sodium 138 135 - 145 mmol/L   Potassium 3.6 3.5 - 5.1 mmol/L   Chloride 105 98 - 111 mmol/L   CO2 18 (L) 22 - 32 mmol/L   Glucose, Bld 70 70 - 99 mg/dL    Comment: Glucose reference range applies only to samples taken after fasting for at least 8 hours.   BUN 9 6 - 20 mg/dL   Creatinine, Ser 0.27 0.44 - 1.00 mg/dL   Calcium 8.9 8.9 - 25.3 mg/dL   Total Protein 7.4 6.5 - 8.1 g/dL   Albumin 3.7 3.5 - 5.0 g/dL   AST 81 (H) 15 - 41 U/L   ALT QUANTITY NOT SUFFICIENT, UNABLE TO PERFORM TEST 0 - 44 U/L   Alkaline Phosphatase 122 38 - 126 U/L   Total Bilirubin QUANTITY NOT SUFFICIENT, UNABLE TO PERFORM TEST 0.3 - 1.2 mg/dL   GFR calc non Af Amer >60 >60 mL/min   GFR calc Af Amer >60 >60 mL/min   Anion gap 15 5 - 15    Comment: Performed at [facility], [address], [place], [place] [address]  Lactic acid, plasma     Status: None   Collection Time: 05/29/20  8:35 AM  Result Value Ref Range   Lactic Acid, Venous 1.0 0.5 - 1.9 mmol/L    Comment: Performed at [facility], [address] [place], [place] [address]  ALT     Status: Abnormal   Collection Time: 05/29/20  8:35 AM  Result Value Ref Range   ALT 243 (H) 0 - 44 U/L    Comment: Performed at [facility], [address], [place], [place] [address]  Bilirubin, total     Status: Abnormal   Collection Time: 05/29/20  8:35 AM  Result Value Ref Range   Total Bilirubin 6.3 (H) 0.3 - 1.2 mg/dL    Comment: Performed at [facility], [address], [place], [place] [address]  Hepatic function panel     Status: Abnormal   Collection Time: 05/29/20  8:35 AM  Result Value Ref Range   Total Protein 6.8 6.5 - 8.1 g/dL   Albumin 3.5 3.5 - 5.0 g/dL   AST 71 (H) 15 - 41 U/L   ALT 243 (H) 0 - 44 U/L   Alkaline Phosphatase 114 38 - 126 U/L   Total Bilirubin 6.0 (H) 0.3 - 1.2 mg/dL   Bilirubin, Direct 3.9 (H) 0.0 - 0.2 mg/dL   Indirect Bilirubin 2.1 (H) 0.3 - 0.9  mg/dL    Comment: Performed at [facility], [address], [place], [place] [address]   US Abdomen Limited RUQ  Result Date: 05/29/2020 CLINICAL DATA:  Abdominal pain, question of choledocholithiasis EXAM: ULTRASOUND ABDOMEN LIMITED RIGHT UPPER QUADRANT COMPARISON:  None. FINDINGS: Gallbladder: Layering hyperechoic sludge/small stones is  seen. No sonographic Murphy sign noted by sonographer. Common bile duct: Diameter: 7 mm Liver: No focal lesion identified. Within normal limits in parenchymal echogenicity. Portal vein is patent on color Doppler imaging with normal direction of blood flow towards the liver. Other: None. IMPRESSION: Cholelithiasis/sludge without evidence of acute cholecystitis. Electronically Signed   By: Jonna Clark M.D.   On: 05/29/2020 05:20      Assessment/Plan Choledocholithiasis - CT showed gallbladder and CBD sludge with prominent CBD - US showed CBD 7 mm and layering stones/sludge - Tbili 6.0, LFTs elevated, WBC 6.3 - ERCP today - likely plan lap chole tomorrow as OR scheduling allows - discussed with patient and her husband. Reviewed risks and benefits of proceeding with surgery and encouraged and answered all questions. They wish to proceed. - ok to have CLD after ERCP tonight, NPO after MN  FEN: NPO, IVF VTE: SCDs ID: [email or web address] [id number]>>  Juliet Rude, [facility] 05/29/2020, 11:48 AM Please see Amion for pager number during day hours 7:00am-4:30pm

## 2020-05-30 ENCOUNTER — Encounter (HOSPITAL_COMMUNITY): Payer: Self-pay | Admitting: Internal Medicine

## 2020-05-30 ENCOUNTER — Inpatient Hospital Stay (HOSPITAL_COMMUNITY): Payer: BC Managed Care – PPO | Admitting: Certified Registered"

## 2020-05-30 ENCOUNTER — Encounter (HOSPITAL_COMMUNITY)
Admission: EM | Disposition: A | Discharge status: Home / Self Care | Payer: Self-pay | Source: Other Acute Inpatient Hospital | Attending: Internal Medicine

## 2020-05-30 HISTORY — PX: CHOLECYSTECTOMY: SHX55

## 2020-05-30 LAB — PREGNANCY, URINE: Preg Test, Ur: NEGATIVE

## 2020-05-30 SURGERY — LAPAROSCOPIC CHOLECYSTECTOMY
Anesthesia: General | Site: Abdomen

## 2020-05-30 MED ORDER — FENTANYL CITRATE (PF) 250 MCG/5ML IJ SOLN
INTRAMUSCULAR | Status: AC
  Filled 2020-05-30: qty 5

## 2020-05-30 MED ORDER — LIDOCAINE 2% (20 MG/ML) 5 ML SYRINGE
INTRAMUSCULAR | Status: AC
  Filled 2020-05-30: qty 5

## 2020-05-30 MED ORDER — ONDANSETRON HCL 4 MG/2ML IJ SOLN
INTRAMUSCULAR | Status: DC | PRN
  Administered 2020-05-30: 4 mg via INTRAVENOUS

## 2020-05-30 MED ORDER — CHLORHEXIDINE GLUCONATE 0.12 % MT SOLN
OROMUCOSAL | Status: AC
  Administered 2020-05-30: 15 mL via OROMUCOSAL
  Filled 2020-05-30: qty 15

## 2020-05-30 MED ORDER — ROCURONIUM BROMIDE 10 MG/ML (PF) SYRINGE
PREFILLED_SYRINGE | INTRAVENOUS | Status: DC | PRN
  Administered 2020-05-30: 50 mg via INTRAVENOUS

## 2020-05-30 MED ORDER — PROPOFOL 10 MG/ML IV BOLUS
INTRAVENOUS | Status: DC | PRN
  Administered 2020-05-30: 160 mg via INTRAVENOUS

## 2020-05-30 MED ORDER — HEMOSTATIC AGENTS (NO CHARGE) OPTIME
TOPICAL | Status: DC | PRN
  Administered 2020-05-30: 1 via TOPICAL

## 2020-05-30 MED ORDER — MORPHINE SULFATE (PF) 2 MG/ML IV SOLN
1.0000 mg | INTRAVENOUS | Status: DC | PRN
  Administered 2020-05-30: 2 mg via INTRAVENOUS
  Filled 2020-05-30: qty 1

## 2020-05-30 MED ORDER — BUPIVACAINE HCL (PF) 0.25 % IJ SOLN
INTRAMUSCULAR | Status: AC
  Filled 2020-05-30: qty 30

## 2020-05-30 MED ORDER — PROPOFOL 10 MG/ML IV BOLUS
INTRAVENOUS | Status: AC
  Filled 2020-05-30: qty 20

## 2020-05-30 MED ORDER — DEXAMETHASONE SODIUM PHOSPHATE 10 MG/ML IJ SOLN
INTRAMUSCULAR | Status: DC | PRN
Start: 2020-05-30 — End: 2020-05-30
  Administered 2020-05-30: 5 mg via INTRAVENOUS

## 2020-05-30 MED ORDER — BUPIVACAINE-EPINEPHRINE 0.25% -1:200000 IJ SOLN
INTRAMUSCULAR | Status: DC | PRN
  Administered 2020-05-30: 10 mL

## 2020-05-30 MED ORDER — SODIUM CHLORIDE (PF) 0.9 % IJ SOLN
INTRAMUSCULAR | Status: AC
  Filled 2020-05-30: qty 50

## 2020-05-30 MED ORDER — FENTANYL CITRATE (PF) 100 MCG/2ML IJ SOLN
INTRAMUSCULAR | Status: AC
  Administered 2020-05-30: 50 ug via INTRAVENOUS
  Filled 2020-05-30: qty 2

## 2020-05-30 MED ORDER — FENTANYL CITRATE (PF) 100 MCG/2ML IJ SOLN
25.0000 ug | INTRAMUSCULAR | Status: DC | PRN
  Administered 2020-05-30 (×2): 50 ug via INTRAVENOUS

## 2020-05-30 MED ORDER — ROCURONIUM BROMIDE 10 MG/ML (PF) SYRINGE
PREFILLED_SYRINGE | INTRAVENOUS | Status: AC
  Filled 2020-05-30: qty 10

## 2020-05-30 MED ORDER — BUPIVACAINE-EPINEPHRINE (PF) 0.5% -1:200000 IJ SOLN
INTRAMUSCULAR | Status: DC | PRN
  Administered 2020-05-30 (×2): 15 mL via PERINEURAL

## 2020-05-30 MED ORDER — FENTANYL CITRATE (PF) 100 MCG/2ML IJ SOLN: 50.0000 ug | Freq: Once | INTRAMUSCULAR | Status: AC

## 2020-05-30 MED ORDER — CHLORHEXIDINE GLUCONATE 0.12 % MT SOLN: 15.0000 mL | Freq: Once | OROMUCOSAL | Status: AC

## 2020-05-30 MED ORDER — OXYCODONE HCL 5 MG PO TABS: 5.0000 mg | ORAL_TABLET | Freq: Four times a day (QID) | ORAL | 0 refills | Status: AC | PRN

## 2020-05-30 MED ORDER — LACTATED RINGERS IV SOLN: INTRAVENOUS | Status: DC

## 2020-05-30 MED ORDER — MIDAZOLAM HCL 2 MG/2ML IJ SOLN: 2.0000 mg | Freq: Once | INTRAMUSCULAR | Status: AC

## 2020-05-30 MED ORDER — FENTANYL CITRATE (PF) 100 MCG/2ML IJ SOLN
INTRAMUSCULAR | Status: AC
  Filled 2020-05-30: qty 2

## 2020-05-30 MED ORDER — SODIUM CHLORIDE 0.9 % IV SOLN: INTRAVENOUS | Status: DC

## 2020-05-30 MED ORDER — 0.9 % SODIUM CHLORIDE (POUR BTL) OPTIME
TOPICAL | Status: DC | PRN
  Administered 2020-05-30: 1000 mL

## 2020-05-30 MED ORDER — ONDANSETRON HCL 4 MG/2ML IJ SOLN: 4.0000 mg | Freq: Four times a day (QID) | INTRAMUSCULAR | Status: DC | PRN

## 2020-05-30 MED ORDER — SPY AGENT GREEN - (INDOCYANINE FOR INJECTION)
INTRAMUSCULAR | Status: DC | PRN
  Administered 2020-05-30: 2 mL via TOPICAL

## 2020-05-30 MED ORDER — ORAL CARE MOUTH RINSE: 15.0000 mL | Freq: Once | OROMUCOSAL | Status: AC

## 2020-05-30 MED ORDER — MIDAZOLAM HCL 5 MG/5ML IJ SOLN: INTRAMUSCULAR | Status: DC | PRN

## 2020-05-30 MED ORDER — OXYCODONE HCL 5 MG PO TABS
5.0000 mg | ORAL_TABLET | ORAL | Status: DC | PRN
  Administered 2020-05-30 – 2020-05-31 (×4): 5 mg via ORAL
  Filled 2020-05-30 (×4): qty 1

## 2020-05-30 MED ORDER — SUGAMMADEX SODIUM 200 MG/2ML IV SOLN
INTRAVENOUS | Status: DC | PRN
  Administered 2020-05-30: 140 mg via INTRAVENOUS

## 2020-05-30 MED ORDER — MIDAZOLAM HCL 2 MG/2ML IJ SOLN
INTRAMUSCULAR | Status: AC
  Administered 2020-05-30: 2 mg via INTRAVENOUS
  Filled 2020-05-30: qty 2

## 2020-05-30 MED ORDER — OXYCODONE HCL 5 MG/5ML PO SOLN: 5.0000 mg | Freq: Once | ORAL | Status: DC | PRN

## 2020-05-30 MED ORDER — FENTANYL CITRATE (PF) 100 MCG/2ML IJ SOLN
INTRAMUSCULAR | Status: DC | PRN
  Administered 2020-05-30: 150 ug via INTRAVENOUS
  Administered 2020-05-30: 100 ug via INTRAVENOUS

## 2020-05-30 MED ORDER — SODIUM CHLORIDE 0.9 % IR SOLN
Status: DC | PRN
  Administered 2020-05-30: 1000 mL

## 2020-05-30 MED ORDER — OXYCODONE HCL 5 MG PO TABS: 5.0000 mg | ORAL_TABLET | Freq: Once | ORAL | Status: DC | PRN

## 2020-05-30 MED ORDER — LIDOCAINE 2% (20 MG/ML) 5 ML SYRINGE
INTRAMUSCULAR | Status: DC | PRN
  Administered 2020-05-30: 50 mg via INTRAVENOUS

## 2020-05-30 SURGICAL SUPPLY — 43 items
APPLICATOR ARISTA FLEXITIP XL (MISCELLANEOUS) ×3 IMPLANT
APPLIER CLIP 5 13 M/L LIGAMAX5 (MISCELLANEOUS) ×3
BLADE CLIPPER SURG (BLADE) IMPLANT
CANISTER SUCT 3000ML PPV (MISCELLANEOUS) ×3 IMPLANT
CHLORAPREP W/TINT 26 (MISCELLANEOUS) ×3 IMPLANT
CLIP APPLIE 5 13 M/L LIGAMAX5 (MISCELLANEOUS) ×2 IMPLANT
CLOSURE STERI-STRIP 1/4X4 (GAUZE/BANDAGES/DRESSINGS) ×3 IMPLANT
COVER MAYO STAND STRL (DRAPES) IMPLANT
COVER SURGICAL LIGHT HANDLE (MISCELLANEOUS) ×3 IMPLANT
COVER WAND RF STERILE (DRAPES) ×3 IMPLANT
DERMABOND ADVANCED (GAUZE/BANDAGES/DRESSINGS) ×1
DERMABOND ADVANCED .7 DNX12 (GAUZE/BANDAGES/DRESSINGS) ×2 IMPLANT
DRAPE C-ARM 42X120 X-RAY (DRAPES) IMPLANT
ELECT REM PT RETURN 9FT ADLT (ELECTROSURGICAL) ×3
ELECTRODE REM PT RTRN 9FT ADLT (ELECTROSURGICAL) ×2 IMPLANT
GLOVE BIO SURGEON STRL SZ7 (GLOVE) ×3 IMPLANT
GLOVE BIOGEL PI IND STRL 7.5 (GLOVE) ×2 IMPLANT
GLOVE BIOGEL PI INDICATOR 7.5 (GLOVE) ×1
GOWN STRL REUS W/ TWL LRG LVL3 (GOWN DISPOSABLE) ×6 IMPLANT
GOWN STRL REUS W/TWL LRG LVL3 (GOWN DISPOSABLE) ×3
GRASPER SUT TROCAR 14GX15 (MISCELLANEOUS) ×3 IMPLANT
HEMOSTAT ARISTA ABSORB 3G PWDR (HEMOSTASIS) ×3 IMPLANT
KIT BASIN OR (CUSTOM PROCEDURE TRAY) ×3 IMPLANT
KIT TURNOVER KIT B (KITS) ×3 IMPLANT
NS IRRIG 1000ML POUR BTL (IV SOLUTION) ×3 IMPLANT
PAD ARMBOARD 7.5X6 YLW CONV (MISCELLANEOUS) ×3 IMPLANT
POUCH RETRIEVAL ECOSAC 10 (ENDOMECHANICALS) ×2 IMPLANT
POUCH RETRIEVAL ECOSAC 10MM (ENDOMECHANICALS) ×1
SCISSORS LAP 5X35 DISP (ENDOMECHANICALS) ×3 IMPLANT
SET CHOLANGIOGRAPH 5 50 .035 (SET/KITS/TRAYS/PACK) IMPLANT
SET IRRIG TUBING LAPAROSCOPIC (IRRIGATION / IRRIGATOR) ×3 IMPLANT
SET TUBE SMOKE EVAC HIGH FLOW (TUBING) ×3 IMPLANT
SLEEVE ENDOPATH XCEL 5M (ENDOMECHANICALS) ×6 IMPLANT
SPECIMEN JAR SMALL (MISCELLANEOUS) ×3 IMPLANT
STRIP CLOSURE SKIN 1/2X4 (GAUZE/BANDAGES/DRESSINGS) ×3 IMPLANT
SUT MNCRL AB 4-0 PS2 18 (SUTURE) ×3 IMPLANT
SUT VICRYL 0 UR6 27IN ABS (SUTURE) ×6 IMPLANT
TOWEL GREEN STERILE (TOWEL DISPOSABLE) ×3 IMPLANT
TOWEL GREEN STERILE FF (TOWEL DISPOSABLE) ×3 IMPLANT
TRAY LAPAROSCOPIC MC (CUSTOM PROCEDURE TRAY) ×3 IMPLANT
TROCAR XCEL BLUNT TIP 100MML (ENDOMECHANICALS) ×3 IMPLANT
TROCAR XCEL NON-BLD 5MMX100MML (ENDOMECHANICALS) ×3 IMPLANT
WATER STERILE IRR 1000ML POUR (IV SOLUTION) ×3 IMPLANT

## 2020-05-30 NOTE — Progress Notes (Signed)
Subjective: Patient was seen in her room as she was trying to walk around in the hallways. Denies abdominal pain but feels gassy. Was able to tolerate clear liquids, had some juice yesterday evening without pain, nausea or vomiting.  Objective: Vital signs in last 24 hours: Temp:  [98.1 F (36.7 C)-98.7 F (37.1 C)] 98.7 F (37.1 C) (06/17 0434) Pulse Rate:  [67-103] 67 (06/17 0434) Resp:  [12-19] 17 (06/17 0434) BP: (111-153)/(59-81) 111/74 (06/17 0434) SpO2:  [96 %-99 %] 99 % (06/17 0434) Weight change:  Last BM Date: 05/28/20  PE: Not in distress GENERAL: Mild icterus ABDOMEN: Nondistended EXTREMITIES: No deformity  Lab Results: Results for orders placed or performed during the hospital encounter of 05/29/20 (from the past 48 hour(s))  Surgical pcr screen     Status: None   Collection Time: 05/29/20  3:36 AM   Specimen: Nasal Mucosa; Nasal Swab  Result Value Ref Range   MRSA, PCR NEGATIVE NEGATIVE   Staphylococcus aureus NEGATIVE NEGATIVE    Comment: (NOTE) The Xpert SA Assay (FDA approved for NASAL specimens in patients 36 years of age and older), is one component of a comprehensive surveillance program. It is not intended to diagnose infection nor to guide or monitor treatment. Performed at [facility], [address] [place], [place] [address]   HIV Antibody (routine testing w rflx)     Status: None   Collection Time: 05/29/20  3:56 AM  Result Value Ref Range   HIV Screen 4th Generation wRfx Non Reactive Non Reactive    Comment: Performed at [facility], [address], [place], [place] [address]  CBC     Status: None   Collection Time: 05/29/20  3:56 AM  Result Value Ref Range   WBC 6.3 4.0 - 10.5 K/uL   RBC 4.55 3.87 - 5.11 MIL/uL   Hemoglobin 13.4 12.0 - 15.0 g/dL   HCT 46.9 36 - 46 %   MCV 91.2 80.0 - 100.0 fL   MCH 29.5 26.0 - 34.0 pg   MCHC 32.3 30.0 - 36.0 g/dL   RDW 62.9 52.8 - 41.3 %   Platelets 164 150 - 400 K/uL   nRBC 0.0 0.0 -  0.2 %    Comment: Performed at [facility], [address] [place], [place] [address]  Comprehensive metabolic panel     Status: Abnormal   Collection Time: 05/29/20  3:56 AM  Result Value Ref Range   Sodium 138 135 - 145 mmol/L   Potassium 3.6 3.5 - 5.1 mmol/L   Chloride 105 98 - 111 mmol/L   CO2 18 (L) 22 - 32 mmol/L   Glucose, Bld 70 70 - 99 mg/dL    Comment: Glucose reference range applies only to samples taken after fasting for at least 8 hours.   BUN 9 6 - 20 mg/dL   Creatinine, Ser 0.27 0.44 - 1.00 mg/dL   Calcium 8.9 8.9 - 25.3 mg/dL   Total Protein 7.4 6.5 - 8.1 g/dL   Albumin 3.7 3.5 - 5.0 g/dL   AST 81 (H) 15 - 41 U/L   ALT QUANTITY NOT SUFFICIENT, UNABLE TO PERFORM TEST 0 - 44 U/L   Alkaline Phosphatase 122 38 - 126 U/L   Total Bilirubin QUANTITY NOT SUFFICIENT, UNABLE TO PERFORM TEST 0.3 - 1.2 mg/dL   GFR calc non Af Amer >60 >60 mL/min   GFR calc Af Amer >60 >60 mL/min   Anion gap 15 5 -  15    Comment: Performed at [facility], [address] [place], [place] [address]  Lactic acid, plasma     Status: None   Collection Time: 05/29/20  8:35 AM  Result Value Ref Range   Lactic Acid, Venous 1.0 0.5 - 1.9 mmol/L    Comment: Performed at [facility], [address] [place], [place] [address]  ALT     Status: Abnormal   Collection Time: 05/29/20  8:35 AM  Result Value Ref Range   ALT 243 (H) 0 - 44 U/L    Comment: Performed at [facility], [address], [place], [place] [address]  Bilirubin, total     Status: Abnormal   Collection Time: 05/29/20  8:35 AM  Result Value Ref Range   Total Bilirubin 6.3 (H) 0.3 - 1.2 mg/dL    Comment: Performed at [facility], [address] [place], [place] [address]  Hepatic function panel     Status: Abnormal   Collection Time: 05/29/20  8:35 AM  Result Value Ref Range   Total Protein 6.8 6.5 - 8.1 g/dL   Albumin 3.5 3.5 - 5.0 g/dL   AST 71 (H) 15 - 41 U/L   ALT 243 (H) 0 - 44 U/L    Alkaline Phosphatase 114 38 - 126 U/L   Total Bilirubin 6.0 (H) 0.3 - 1.2 mg/dL   Bilirubin, Direct 3.9 (H) 0.0 - 0.2 mg/dL   Indirect Bilirubin 2.1 (H) 0.3 - 0.9 mg/dL    Comment: Performed at [facility], [address] [place], [place] [address]  SARS Coronavirus 2 by RT PCR (hospital order, performed in [facility] hospital lab) Nasopharyngeal Nasopharyngeal Swab     Status: None   Collection Time: 05/29/20 10:30 AM   Specimen: Nasopharyngeal Swab  Result Value Ref Range   SARS Coronavirus 2 NEGATIVE NEGATIVE    Comment: (NOTE) SARS-CoV-2 target nucleic acids are NOT DETECTED.  The SARS-CoV-2 RNA is generally detectable in upper and lower respiratory specimens during the acute phase of infection. The lowest concentration of SARS-CoV-2 viral copies this assay can detect is 250 copies / mL. A negative result does not preclude SARS-CoV-2 infection and should not be used as the sole basis for treatment or other patient management decisions.  A negative result may occur with improper specimen collection / handling, submission of specimen other than nasopharyngeal swab, presence of viral mutation(s) within the areas targeted by this assay, and inadequate number of viral copies (<250 copies / mL). A negative result must be combined with clinical observations, patient history, and epidemiological information.  Fact Sheet for Patients:   BoilerBrush.com.cy  Fact Sheet for Healthcare Providers: https://pope.com/  This test is not yet approved or  cleared by the [place] FDA and has been authorized for detection and/or diagnosis of SARS-CoV-2 by FDA under an Emergency Use Authorization (EUA).  This EUA will remain in effect (meaning this test can be used) for the duration of the COVID-19 declaration under Section 564(b)(1) of the Act, 21 U.S.C. section 360bbb-3(b)(1), unless the authorization is terminated or revoked  sooner.  Performed at [facility], [address] [place], [place] [address]     Studies/Results: DG ERCP BILIARY & PANCREATIC DUCTS  Result Date: 05/29/2020 CLINICAL DATA:  49 year old female with a history of choledocholithiasis EXAM: ERCP TECHNIQUE: Multiple spot images obtained with the fluoroscopic device and submitted for interpretation post-procedure. FLUOROSCOPY TIME:  Fluoroscopy Time:  4 minutes 43 seconds COMPARISON:  None. FINDINGS: Limited intraoperative fluoroscopic spot images during ERCP. Initial image demonstrates the endoscope projecting over the upper abdomen. Subsequently there is cannulation of the ampulla and retrograde infusion of contrast into the extrahepatic ductal system. No obvious filling defects of the CBD and the common hepatic duct Incomplete contrast opacification of the cystic duct, of uncertain significance. IMPRESSION: Limited images during ERCP demonstrates no evidence of filling defects within the common bile duct or common hepatic duct. There is vague contrast column in the region of the cystic duct with incomplete opacification, of uncertain significance. Please refer to the dictated operative report for full details of intraoperative findings and procedure. Electronically Signed   By: Corrie Mckusick D.O.   On: 05/29/2020 16:56   US Abdomen Limited RUQ  Result Date: 05/29/2020 CLINICAL DATA:  Abdominal pain, question of choledocholithiasis EXAM: ULTRASOUND ABDOMEN LIMITED RIGHT UPPER QUADRANT COMPARISON:  None. FINDINGS: Gallbladder: Layering hyperechoic sludge/small stones is seen. No sonographic Murphy sign noted by sonographer. Common bile duct: Diameter: 7 mm Liver: No focal lesion identified. Within normal limits in parenchymal echogenicity. Portal vein is patent on color Doppler imaging with normal direction of blood flow towards the liver. Other: None. IMPRESSION: Cholelithiasis/sludge without evidence of acute cholecystitis. Electronically Signed    By: Prudencio Pair M.D.   On: 05/29/2020 05:20    Medications: I have reviewed the patient's current medications.  Assessment: Sludge noted in gallbladder, cystic duct, CBD-causing biliary colic and elevated liver enzymes Status post ERCP with sphincterotomy and removal of thick sludge from CBD  Plan: Cholecystectomy being planned for today. We will sign off from GI standpoint, please recall if needed.  Ronnette Juniper, MD 05/30/2020, 8:18 AM

## 2020-05-30 NOTE — Anesthesia Procedure Notes (Signed)
Procedure Name: Intubation Date/Time: 05/30/2020 12:17 PM Performed by: Rosiland Oz, CRNA Pre-anesthesia Checklist: Patient identified, Emergency Drugs available, Suction available and Patient being monitored Patient Re-evaluated:Patient Re-evaluated prior to induction Oxygen Delivery Method: Circle System Utilized Preoxygenation: Pre-oxygenation with 100% oxygen Induction Type: IV induction Ventilation: Mask ventilation without difficulty Laryngoscope Size: Miller and 2 Grade View: Grade I Tube type: Oral Tube size: 7.0 mm Number of attempts: 1 Airway Equipment and Method: Stylet Placement Confirmation: ETT inserted through vocal cords under direct vision,  positive ETCO2 and breath sounds checked- equal and bilateral Secured at: 22 cm Tube secured with: Tape Dental Injury: Teeth and Oropharynx as per pre-operative assessment

## 2020-05-30 NOTE — Op Note (Signed)
Preoperative diagnosis: resolved choledocholithiasis Postoperative diagnosis: same as above Procedure: laparoscopic cholecystectomy, ICG dye injection Surgeon: Dr Harden Mo Asst Myrtie Soman Anes: general  EBL: 30 cc Complications: none Drains none Specimens gallbladder and contents to pathology Sponge and needle count correct dispo to recovery stable.  Indications: 35 yof s/p ercp for choledocholithiasis presents for laparoscopic cholecystectomy. We discussed surgery and recovery.    Procedure: After informed consent was obtained the patient was taken to the operating room. She was given antibiotics. SCDs were placed. She was placed under general anesthesia without complication. She was prepped and draped in the standard sterile surgical fashion. A surgical timeout was then performed.  I infiltrated Marcaine below the umbilicus. I made a vertical incision and grasped the fascia. I entered the fascia and entered then entered the peritoneum bluntly. There was no evidence of an entry injury. I placed 2-0 Vicryl pursestring suture through the fascia. I then inserted a Hassan trocar and insufflated the abdomen to 15 mmHg pressure. I then inserted 3 further 5 mm trocars in epigastrium and right upper quadrant under direct vision without complication.  I then retracted it cephalad and lateral. I did use the ICG dye as well as anatomical landmarks to identify the common bile duct and obtain the critical view of safety.  I then clipped the cystic duct 3 times. The clips completely went across the duct and the duct was viable. I treated the artery in a similar fashion. I then removed the gallbladder from the liver bed.  I placed this in a retrieval bag and removed it from the umbilicus. I then obtained hemostasis on the liver. I did place arista in the gallbladder fossa. I then evacuated all the fluid. I removed my Hassan trocar and tied my pursestring down. I placed  additional 0 Vicryl sutures using the suture passer device to completely obliterate this defect. I then desufflated the abdomen and removed all the trocars. I then closed these with 4-0 Monocryl and glue. She tolerated this well was extubated and transferred to recovery stable.

## 2020-05-30 NOTE — Anesthesia Procedure Notes (Signed)
Anesthesia Regional Block: TAP block   Pre-Anesthetic Checklist: ,, timeout performed, Correct Patient, Correct Site, Correct Laterality, Correct Procedure, Correct Position, site marked, Risks and benefits discussed,  Surgical consent,  Pre-op evaluation,  At surgeon's request and post-op pain management  Laterality: Right  Prep: chloraprep       Needles:  Injection technique: Single-shot  Needle Type: Echogenic Needle     Needle Length: 9cm  Needle Gauge: 21     Additional Needles:   Narrative:  Start time: 05/30/2020 11:10 AM End time: 05/30/2020 11:17 AM Injection made incrementally with aspirations every 5 mL.  Performed by: Personally  Anesthesiologist: Achille Rich, MD  Additional Notes: Pt tolerated the procedure well.

## 2020-05-30 NOTE — Discharge Instructions (Signed)
[facility]  Please arrive at least 30 min before your appointment to complete your check in paperwork.  If you are unable to arrive 30 min prior to your appointment time we may have to cancel or reschedule you. LAPAROSCOPIC SURGERY: POST OP INSTRUCTIONS Always review your discharge instruction sheet given to you by the facility where your surgery was performed. IF YOU HAVE DISABILITY OR FAMILY LEAVE FORMS, YOU MUST BRING THEM TO THE OFFICE FOR PROCESSING.   DO NOT GIVE THEM TO YOUR DOCTOR.  PAIN CONTROL  1. First take acetaminophen (Tylenol) AND/or ibuprofen (Advil) to control your pain after surgery.  Follow directions on package.  Taking acetaminophen (Tylenol) and/or ibuprofen (Advil) regularly after surgery will help to control your pain and lower the amount of prescription pain medication you may need.  You should not take more than 4,000 mg (4 grams) of acetaminophen (Tylenol) in 24 hours.  You should not take ibuprofen (Advil), aleve, motrin, naprosyn or other NSAIDS if you have a history of stomach ulcers or chronic kidney disease.  2. A prescription for pain medication may be given to you upon discharge.  Take your pain medication as prescribed, if you still have uncontrolled pain after taking acetaminophen (Tylenol) or ibuprofen (Advil). 3. Use ice packs to help control pain. 4. If you need a refill on your pain medication, please contact your pharmacy.  They will contact our office to request authorization. Prescriptions will not be filled after 5pm or on week-ends.  HOME MEDICATIONS 5. Take your usually prescribed medications unless otherwise directed.  DIET 6. You should follow a light diet the first few days after arrival home.  Be sure to include lots of fluids daily. Avoid fatty, fried foods.   CONSTIPATION 7. It is common to experience some constipation after surgery and if you are taking pain medication.  Increasing fluid intake and taking a stool  softener (such as Colace) will usually help or prevent this problem from occurring.  A mild laxative (Milk of Magnesia or Miralax) should be taken according to package instructions if there are no bowel movements after 48 hours.  WOUND/INCISION CARE 8. Most patients will experience some swelling and bruising in the area of the incisions.  Ice packs will help.  Swelling and bruising can take several days to resolve.  9. Unless discharge instructions indicate otherwise, follow guidelines below  a. STERI-STRIPS - you may remove your outer bandages 48 hours after surgery, and you may shower at that time.  You have steri-strips (small skin tapes) in place directly over the incision.  These strips should be left on the skin for 7-10 days.   b. DERMABOND/SKIN GLUE - you may shower in 24 hours.  The glue will flake off over the next 2-3 weeks. 10. Any sutures or staples will be removed at the office during your follow-up visit.  ACTIVITIES 11. You may resume regular (light) daily activities beginning the next day--such as daily self-care, walking, climbing stairs--gradually increasing activities as tolerated.  You may have sexual intercourse when it is comfortable.  Refrain from any heavy lifting or straining until approved by your doctor. a. You may drive when you are no longer taking prescription pain medication, you can comfortably wear a seatbelt, and you can safely maneuver your car and apply brakes.  FOLLOW-UP 12. You should see your doctor in the office for a follow-up appointment approximately 2-3 weeks after your surgery.  You should have been given your post-op/follow-up appointment when   your surgery was scheduled.  If you did not receive a post-op/follow-up appointment, make sure that you call for this appointment within a day or two after you arrive home to insure a convenient appointment time.   WHEN TO CALL YOUR DOCTOR: 1. Fever over 101.0 2. Inability to urinate 3. Continued bleeding from  incision. 4. Increased pain, redness, or drainage from the incision. 5. Increasing abdominal pain  The clinic staff is available to answer your questions during regular business hours.  Please don't hesitate to call and ask to speak to one of the nurses for clinical concerns.  If you have a medical emergency, go to the nearest emergency room or call 911.  A surgeon from [facility] is always on call at the hospital. [address], [address], [place], [place]  [address] ? [address] [address], [place],    [address] [phone] ? [phone] ? FAX [phone]  .........   Managing Your Pain After Surgery Without Opioids    Thank you for participating in our program to help patients manage their pain after surgery without opioids. This is part of our effort to provide you with the best care possible, without exposing you or your family to the risk that opioids pose.  What pain can I expect after surgery? You can expect to have some pain after surgery. This is normal. The pain is typically worse the day after surgery, and quickly begins to get better. Many studies have found that many patients are able to manage their pain after surgery with Over-the-Counter (OTC) medications such as Tylenol and Motrin. If you have a condition that does not allow you to take Tylenol or Motrin, notify your surgical team.  How will I manage my pain? The best strategy for controlling your pain after surgery is around the clock pain control with Tylenol (acetaminophen) and Motrin (ibuprofen or Advil). Alternating these medications with each other allows you to maximize your pain control. In addition to Tylenol and Motrin, you can use heating pads or ice packs on your incisions to help reduce your pain.  How will I alternate your regular strength over-the-counter pain medication? You will take a dose of pain medication every three hours. ; Start by taking 650 mg of Tylenol (2 pills of 325  mg) ; 3 hours later take 600 mg of Motrin (3 pills of 200 mg) ; 3 hours after taking the Motrin take 650 mg of Tylenol ; 3 hours after that take 600 mg of Motrin.   - 1 -  See example - if your first dose of Tylenol is at 12:00 PM   12:00 PM Tylenol 650 mg (2 pills of 325 mg)  3:00 PM Motrin 600 mg (3 pills of 200 mg)  6:00 PM Tylenol 650 mg (2 pills of 325 mg)  9:00 PM Motrin 600 mg (3 pills of 200 mg)  Continue alternating every 3 hours   We recommend that you follow this schedule around-the-clock for at least 3 days after surgery, or until you feel that it is no longer needed. Use the table on the last page of this handout to keep track of the medications you are taking. Important: Do not take more than 3000mg of Tylenol or 3200mg of Motrin in a 24-hour period. Do not take ibuprofen/Motrin if you have a history of bleeding stomach ulcers, severe kidney disease, &/or actively taking a blood thinner  What if I still have pain? If you have pain that is not   controlled with the over-the-counter pain medications (Tylenol and Motrin or Advil) you might have what we call "breakthrough" pain. You will receive a prescription for a small amount of an opioid pain medication such as Oxycodone, Tramadol, or Tylenol with Codeine. Use these opioid pills in the first 24 hours after surgery if you have breakthrough pain. Do not take more than 1 pill every 4-6 hours.  If you still have uncontrolled pain after using all opioid pills, don't hesitate to call our staff using the number provided. We will help make sure you are managing your pain in the best way possible, and if necessary, we can provide a prescription for additional pain medication.   Day 1    Time  Name of Medication Number of pills taken  Amount of Acetaminophen  Pain Level   Comments  AM PM       AM PM       AM PM       AM PM       AM PM       AM PM       AM PM       AM PM       Total Daily amount of Acetaminophen Do not  take more than  3,000 mg per day      Day 2    Time  Name of Medication Number of pills taken  Amount of Acetaminophen  Pain Level   Comments  AM PM       AM PM       AM PM       AM PM       AM PM       AM PM       AM PM       AM PM       Total Daily amount of Acetaminophen Do not take more than  3,000 mg per day      Day 3    Time  Name of Medication Number of pills taken  Amount of Acetaminophen  Pain Level   Comments  AM PM       AM PM       AM PM       AM PM          AM PM       AM PM       AM PM       AM PM       Total Daily amount of Acetaminophen Do not take more than  3,000 mg per day      Day 4    Time  Name of Medication Number of pills taken  Amount of Acetaminophen  Pain Level   Comments  AM PM       AM PM       AM PM       AM PM       AM PM       AM PM       AM PM       AM PM       Total Daily amount of Acetaminophen Do not take more than  3,000 mg per day      Day 5    Time  Name of Medication Number of pills taken  Amount of Acetaminophen  Pain Level   Comments  AM PM       AM PM       AM   PM       AM PM       AM PM       AM PM       AM PM       AM PM       Total Daily amount of Acetaminophen Do not take more than  3,000 mg per day       Day 6    Time  Name of Medication Number of pills taken  Amount of Acetaminophen  Pain Level  Comments  AM PM       AM PM       AM PM       AM PM       AM PM       AM PM       AM PM       AM PM       Total Daily amount of Acetaminophen Do not take more than  3,000 mg per day      Day 7    Time  Name of Medication Number of pills taken  Amount of Acetaminophen  Pain Level   Comments  AM PM       AM PM       AM PM       AM PM       AM PM       AM PM       AM PM       AM PM       Total Daily amount of Acetaminophen Do not take more than  3,000 mg per day        For additional information about how and where to safely dispose of unused  opioid medications - https://www.morepowerfulnc.org  Disclaimer: This document contains information and/or instructional materials adapted from [facility] for the typical patient with your condition. It does not replace medical advice from your health care provider because your experience may differ from that of the typical patient. Talk to your health care provider if you have any questions about this document, your condition or your treatment plan. Adapted from [facility]   

## 2020-05-30 NOTE — Progress Notes (Addendum)
PROGRESS NOTE  Kylie Moyer VFI:[id number] DOB: 1971-01-08 DOA: 05/29/2020 PCP: Patient, No Pcp Per   LOS: 1 day   Brief narrative: As per HPI,  Kylie Moyer is a 49 y.o. female with no significant past medical history presented to [facility] ED on 6/15 with complaints of epigastric and right upper quadrant abdominal pain, nausea x 4 days. She was afebrile but tachycardic. Not hypotensive. There was no leukocytosis. Lactic acid was normal. AST 105, ALT 363, alk phos 167, and T bili 5.7. Lipase 44. CT abdomen revealed evidence of sludge within the gallbladder, cystic duct, and common bile duct with associated CBD dilation.  Also showing evidence of cholelithiasis. Patient was given IV Zosyn. COVID swab negative.   Patient was then transferred to our hospital for further evaluation and treatment.  Assessment/Plan:  Principal Problem:   Choledocholithiasis Active Problems:   Encounter for screening for HIV  Abdominal pain secondary to cholelithiasis//with CBD sludge elevated LFTs and CBD dilatation.  Seen by  GI Dr. Therisa Doyne and underwent ERCP with sphincterectomy and removal of thick sludge from CBD on 05/29/2020.   Surgery for possible cholecystectomy today.   DVT prophylaxis: SCDs Start: 05/29/20 0331   Code Status: Full code  Family Communication: Spoke with the patient's husband at bedside  Status is: Inpatient  Remains inpatient appropriate because:Inpatient level of care appropriate due to severity of illness and Need for cholecystectomy   Dispo:  Patient From: Home  Planned Disposition: Home  Expected discharge date: 05/31/20  Medically stable for discharge: No   Consultants:  GI   General surgery  Procedures: Sphincterectomy and removal of thick sludge from CBD on 05/29/2020.  Antibiotics:  . Rocephin and Flagyl 6/16>   Subjective: Today, patient was seen and examined at bedside.  States that her abdominal pain has improved.  No nausea, vomiting fever chills.   Status post ERCP on 05/29/2020.   Objective: Vitals:   05/29/20 2050 05/30/20 0434  BP: 126/81 111/74  Pulse: 82 67  Resp: 17 17  Temp: 98.1 F (36.7 C) 98.7 F (37.1 C)  SpO2: 97% 99%    Intake/Output Summary (Last 24 hours) at 05/30/2020 0826 Last data filed at 05/29/2020 1700 Gross per 24 hour  Intake 250 ml  Output --  Net 250 ml   Filed Weights   05/29/20 0534  Weight: 69.3 kg   Body mass index is 29.84 kg/m.   Physical Exam: GENERAL: Patient is alert awake and oriented. Not in obvious distress. HENT: No scleral pallor or icterus. Pupils equally reactive to light. Oral mucosa is moist NECK: is supple, no gross swelling noted. CHEST: Clear to auscultation. No crackles or wheezes.  Diminished breath sounds bilaterally. CVS: S1 and S2 heard, no murmur. Regular rate and rhythm.  ABDOMEN: Soft, deep tenderness on palpation.  Bowel sounds are present. EXTREMITIES: No edema. CNS: Cranial nerves are intact. No focal motor deficits. SKIN: warm and dry without rashes.  Data Review: I have personally reviewed the following laboratory data and studies,  CBC: Recent Labs  Lab 05/29/20 0356  WBC 6.3  HGB 13.4  HCT 41.5  MCV 91.2  PLT 416   Basic Metabolic Panel: Recent Labs  Lab 05/29/20 0356  NA 138  K 3.6  CL 105  CO2 18*  GLUCOSE 70  BUN 9  CREATININE 0.80  CALCIUM 8.9   Liver Function Tests: Recent Labs  Lab 05/29/20 0356 05/29/20 0835  AST 81* 71*  ALT QUANTITY NOT SUFFICIENT, UNABLE TO PERFORM TEST 243*  243*  ALKPHOS 122 114  BILITOT QUANTITY NOT SUFFICIENT, UNABLE TO PERFORM TEST 6.0*  6.3*  PROT 7.4 6.8  ALBUMIN 3.7 3.5   No results for input(s): LIPASE, AMYLASE in the last 168 hours. No results for input(s): AMMONIA in the last 168 hours. Cardiac Enzymes: No results for input(s): CKTOTAL, CKMB, CKMBINDEX, TROPONINI in the last 168 hours. BNP (last 3 results) No results for input(s): BNP in the last 8760 hours.  ProBNP (last 3  results) No results for input(s): PROBNP in the last 8760 hours.  CBG: No results for input(s): GLUCAP in the last 168 hours. Recent Results (from the past 240 hour(s))  Surgical pcr screen     Status: None   Collection Time: 05/29/20  3:36 AM   Specimen: Nasal Mucosa; Nasal Swab  Result Value Ref Range Status   MRSA, PCR NEGATIVE NEGATIVE Final   Staphylococcus aureus NEGATIVE NEGATIVE Final    Comment: (NOTE) The Xpert SA Assay (FDA approved for NASAL specimens in patients 68 years of age and older), is one component of a comprehensive surveillance program. It is not intended to diagnose infection nor to guide or monitor treatment. Performed at [facility], [place] [address], [place], [place] [address]   SARS Coronavirus 2 by RT PCR (hospital order, performed in [facility] hospital lab) Nasopharyngeal Nasopharyngeal Swab     Status: None   Collection Time: 05/29/20 10:30 AM   Specimen: Nasopharyngeal Swab  Result Value Ref Range Status   SARS Coronavirus 2 NEGATIVE NEGATIVE Final    Comment: (NOTE) SARS-CoV-2 target nucleic acids are NOT DETECTED.  The SARS-CoV-2 RNA is generally detectable in upper and lower respiratory specimens during the acute phase of infection. The lowest concentration of SARS-CoV-2 viral copies this assay can detect is 250 copies / mL. A negative result does not preclude SARS-CoV-2 infection and should not be used as the sole basis for treatment or other patient management decisions.  A negative result may occur with improper specimen collection / handling, submission of specimen other than nasopharyngeal swab, presence of viral mutation(s) within the areas targeted by this assay, and inadequate number of viral copies (<250 copies / mL). A negative result must be combined with clinical observations, patient history, and epidemiological information.  Fact Sheet for Patients:   StrictlyIdeas.no  Fact Sheet for Healthcare  Providers: BankingDealers.co.za  This test is not yet approved or  cleared by the [place] FDA and has been authorized for detection and/or diagnosis of SARS-CoV-2 by FDA under an Emergency Use Authorization (EUA).  This EUA will remain in effect (meaning this test can be used) for the duration of the COVID-19 declaration under Section 564(b)(1) of the Act, 21 U.S.C. section 360bbb-3(b)(1), unless the authorization is terminated or revoked sooner.  Performed at [facility], [place] [address], [place], [place] [address]      Studies: DG ERCP BILIARY & PANCREATIC DUCTS  Result Date: 05/29/2020 CLINICAL DATA:  49 year old female with a history of choledocholithiasis EXAM: ERCP TECHNIQUE: Multiple spot images obtained with the fluoroscopic device and submitted for interpretation post-procedure. FLUOROSCOPY TIME:  Fluoroscopy Time:  4 minutes 43 seconds COMPARISON:  None. FINDINGS: Limited intraoperative fluoroscopic spot images during ERCP. Initial image demonstrates the endoscope projecting over the upper abdomen. Subsequently there is cannulation of the ampulla and retrograde infusion of contrast into the extrahepatic ductal system. No obvious filling defects of the CBD and the common hepatic duct Incomplete contrast opacification of the cystic duct, of  uncertain significance. IMPRESSION: Limited images during ERCP demonstrates no evidence of filling defects within the common bile duct or common hepatic duct. There is vague contrast column in the region of the cystic duct with incomplete opacification, of uncertain significance. Please refer to the dictated operative report for full details of intraoperative findings and procedure. Electronically Signed   By: Corrie Mckusick D.O.   On: 05/29/2020 16:56   US Abdomen Limited RUQ  Result Date: 05/29/2020 CLINICAL DATA:  Abdominal pain, question of choledocholithiasis EXAM: ULTRASOUND ABDOMEN LIMITED RIGHT UPPER QUADRANT  COMPARISON:  None. FINDINGS: Gallbladder: Layering hyperechoic sludge/small stones is seen. No sonographic Murphy sign noted by sonographer. Common bile duct: Diameter: 7 mm Liver: No focal lesion identified. Within normal limits in parenchymal echogenicity. Portal vein is patent on color Doppler imaging with normal direction of blood flow towards the liver. Other: None. IMPRESSION: Cholelithiasis/sludge without evidence of acute cholecystitis. Electronically Signed   By: Prudencio Pair M.D.   On: 05/29/2020 05:20      Flora Lipps, MD  [facility] 05/30/2020

## 2020-05-30 NOTE — Progress Notes (Signed)
1 Day Post-Op   Subjective/Chief Complaint: Doing fine after ercp, no pain   Objective: Vital signs in last 24 hours: Temp:  [98.1 F (36.7 C)-98.7 F (37.1 C)] 98.7 F (37.1 C) (06/17 0434) Pulse Rate:  [67-103] 67 (06/17 0434) Resp:  [12-19] 17 (06/17 0434) BP: (111-153)/(59-81) 111/74 (06/17 0434) SpO2:  [96 %-99 %] 99 % (06/17 0434) Last BM Date: 05/28/20  Intake/Output from previous day: 06/16 0701 - 06/17 0700 In: 250 [I.V.:250] Out: -  Intake/Output this shift: No intake/output data recorded.  GI: nontender  Lab Results:  Recent Labs    05/29/20 0356  WBC 6.3  HGB 13.4  HCT 41.5  PLT 164   BMET Recent Labs    05/29/20 0356  NA 138  K 3.6  CL 105  CO2 18*  GLUCOSE 70  BUN 9  CREATININE 0.80  CALCIUM 8.9   PT/INR No results for input(s): LABPROT, INR in the last 72 hours. ABG No results for input(s): PHART, HCO3 in the last 72 hours.  Invalid input(s): PCO2, PO2  Studies/Results: DG ERCP BILIARY & PANCREATIC DUCTS  Result Date: 05/29/2020 CLINICAL DATA:  49 year old female with a history of choledocholithiasis EXAM: ERCP TECHNIQUE: Multiple spot images obtained with the fluoroscopic device and submitted for interpretation post-procedure. FLUOROSCOPY TIME:  Fluoroscopy Time:  4 minutes 43 seconds COMPARISON:  None. FINDINGS: Limited intraoperative fluoroscopic spot images during ERCP. Initial image demonstrates the endoscope projecting over the upper abdomen. Subsequently there is cannulation of the ampulla and retrograde infusion of contrast into the extrahepatic ductal system. No obvious filling defects of the CBD and the common hepatic duct Incomplete contrast opacification of the cystic duct, of uncertain significance. IMPRESSION: Limited images during ERCP demonstrates no evidence of filling defects within the common bile duct or common hepatic duct. There is vague contrast column in the region of the cystic duct with incomplete opacification, of  uncertain significance. Please refer to the dictated operative report for full details of intraoperative findings and procedure. Electronically Signed   By: Corrie Mckusick D.O.   On: 05/29/2020 16:56   US Abdomen Limited RUQ  Result Date: 05/29/2020 CLINICAL DATA:  Abdominal pain, question of choledocholithiasis EXAM: ULTRASOUND ABDOMEN LIMITED RIGHT UPPER QUADRANT COMPARISON:  None. FINDINGS: Gallbladder: Layering hyperechoic sludge/small stones is seen. No sonographic Murphy sign noted by sonographer. Common bile duct: Diameter: 7 mm Liver: No focal lesion identified. Within normal limits in parenchymal echogenicity. Portal vein is patent on color Doppler imaging with normal direction of blood flow towards the liver. Other: None. IMPRESSION: Cholelithiasis/sludge without evidence of acute cholecystitis. Electronically Signed   By: Prudencio Pair M.D.   On: 05/29/2020 05:20    Anti-infectives: Anti-infectives (From admission, onward)   Start     Dose/Rate Route Frequency Ordered Stop   05/29/20 0430  cefTRIAXone (ROCEPHIN) 2 g in sodium chloride 0.9 % 100 mL IVPB     Discontinue     2 g 200 mL/hr over 30 Minutes Intravenous Every 24 hours 05/29/20 0417     05/29/20 0430  metroNIDAZOLE (FLAGYL) IVPB 500 mg     Discontinue     500 mg 100 mL/hr over 60 Minutes Intravenous Every 8 hours 05/29/20 0417        Assessment/Plan: Resolved choledocholithiais Lap chole today I discussed the procedure in detail.  We discussed the risks and benefits of a laparoscopic cholecystectomy and possible cholangiogram including, but not limited to bleeding, infection, injury to surrounding structures such as the intestine or  liver, bile leak, retained gallstones, need to convert to an open procedure, prolonged diarrhea, blood clots such as  DVT, common bile duct injury, anesthesia risks, and possible need for additional procedures.  The likelihood of improvement in symptoms and return to the patient's normal status is  good. We discussed the typical post-operative recovery course.   Emelia Loron 05/30/2020

## 2020-05-30 NOTE — Progress Notes (Signed)
Pt arrived to 6N25. Pt alert and awake. Report received from [place], [place]. Pt complain of 8/10 pain. PRN morphine given. See assessment. Will continue to monitor pt.

## 2020-05-30 NOTE — Progress Notes (Signed)
Spoke with Ilean Skill, RN, floor nurse about urine pregnancy test prior to surgery. RN stated she would collect sample this am.

## 2020-05-30 NOTE — Anesthesia Preprocedure Evaluation (Signed)
Anesthesia Evaluation  Patient identified by MRN, date of birth, ID band Patient awake    Reviewed: Allergy & Precautions, H&P , NPO status , Patient's Chart, lab work & pertinent test results  Airway Mallampati: II   Neck ROM: full    Dental   Pulmonary neg pulmonary ROS,    breath sounds clear to auscultation       Cardiovascular negative cardio ROS   Rhythm:regular Rate:Normal     Neuro/Psych    GI/Hepatic gallstones   Endo/Other    Renal/GU      Musculoskeletal   Abdominal   Peds  Hematology   Anesthesia Other Findings   Reproductive/Obstetrics                             Anesthesia Physical Anesthesia Plan  ASA: I  Anesthesia Plan: General   Post-op Pain Management:  Regional for Post-op pain   Induction: Intravenous  PONV Risk Score and Plan: 3 and Ondansetron, Dexamethasone, Midazolam and Treatment may vary due to age or medical condition  Airway Management Planned:   Additional Equipment:   Intra-op Plan:   Post-operative Plan: Extubation in OR  Informed Consent: I have reviewed the patients History and Physical, chart, labs and discussed the procedure including the risks, benefits and alternatives for the proposed anesthesia with the patient or authorized representative who has indicated his/her understanding and acceptance.       Plan Discussed with: CRNA, Anesthesiologist and Surgeon  Anesthesia Plan Comments:         Anesthesia Quick Evaluation

## 2020-05-30 NOTE — Transfer of Care (Signed)
Immediate Anesthesia Transfer of Care Note  Patient: Kylie Moyer  Procedure(s) Performed: LAPAROSCOPIC CHOLECYSTECTOMY (N/A Abdomen)  Patient Location: PACU  Anesthesia Type:General  Level of Consciousness: drowsy and patient cooperative  Airway & Oxygen Therapy: Patient Spontanous Breathing  Post-op Assessment: Report given to RN and Post -op Vital signs reviewed and stable  Post vital signs: Reviewed and stable  Last Vitals:  Vitals Value Taken Time  BP 120/68 05/30/20 1312  Temp 36.7 C 05/30/20 1312  Pulse 71 05/30/20 1316  Resp 18 05/30/20 1316  SpO2 99 % 05/30/20 1316  Vitals shown include unvalidated device data.  Last Pain:  Vitals:   05/30/20 0818  TempSrc:   PainSc: 0-No pain      Patients Stated Pain Goal: 2 (05/29/20 0745)  Complications: No complications documented.

## 2020-05-30 NOTE — Anesthesia Procedure Notes (Signed)
Anesthesia Regional Block: TAP block   Pre-Anesthetic Checklist: ,, timeout performed, Correct Patient, Correct Site, Correct Laterality, Correct Procedure, Correct Position, site marked, Risks and benefits discussed,  Surgical consent,  Pre-op evaluation,  At surgeon's request and post-op pain management  Laterality: Left  Prep: chloraprep       Needles:  Injection technique: Single-shot  Needle Type: Echogenic Needle     Needle Length: 9cm  Needle Gauge: 21     Additional Needles:   Narrative:  Start time: 05/30/2020 11:09 AM End time: 05/30/2020 11:09 AM Injection made incrementally with aspirations every 5 mL.  Performed by: Personally  Anesthesiologist: Achille Rich, MD  Additional Notes: Pt tolerated the procedure well.

## 2020-05-31 LAB — SURGICAL PATHOLOGY

## 2020-05-31 NOTE — Discharge Summary (Signed)
Physician Discharge Summary  Patient ID: Kylie Moyer MRN: [id number] DOB/AGE: September 23, 1971 49 y.o.  Admit date: 05/29/2020 Discharge date: 05/31/2020  Admission Diagnoses: Choledocholithiasis Biliary colic  Discharge Diagnoses:  Principal Problem:   Choledocholithiasis Active Problems:   Encounter for screening for HIV   Discharged Condition: good  Hospital Course: 67 yof transfer with choledocholithiasis. Underwent uneventful ercp followed the next day by laparoscopic cholecystectomy. She is doing well today. Tolerating diet, pain controlled, ambulating and will be discharged home  Consults: None  Significant Diagnostic Studies: none  Treatments: surgery: 1. Ercp 2. Lap chole  Discharge Exam: Blood pressure 109/65, pulse 68, temperature 98.3 F (36.8 C), resp. rate 16, height 5' (1.524 m), weight 69.3 kg, last menstrual period 05/01/2020, SpO2 100 %, not currently breastfeeding. GI: soft approp tender incisions clean  Disposition: Discharge disposition: 01-Home or Self Care        Allergies as of 05/31/2020   No Known Allergies     Medication List    TAKE these medications   oxyCODONE 5 MG immediate release tablet Commonly known as: Oxy IR/ROXICODONE Take 1 tablet (5 mg total) by mouth every 6 (six) hours as needed for moderate pain, severe pain or breakthrough pain.       Follow-up Information    [facility]. Schedule an appointment as soon as possible for a visit.   Contact information: [address] [place] [address] [phone]       Surgery, [place] Follow up on 06/18/2020.   Specialty: General Surgery Why: 9:45am, arrive by 9:15am for paperwork and check in process.  Please bring a photo ID and your insurance card Contact information: [address] [place] [address] [phone]               Signed: Emelia Loron 05/31/2020, 9:01 AM

## 2020-05-31 NOTE — Progress Notes (Signed)
Kylie Moyer to be D/C'd  per MD order. Discussed with the patient and all questions fully answered.  VSS, Skin clean, dry and intact without evidence of skin break down, no evidence of skin tears noted.  IV catheter discontinued intact. Site without signs and symptoms of complications. Dressing and pressure applied.  An After Visit Summary was printed and given to the patient. Patient received prescription.  D/c education completed with patient/family including follow up instructions, medication list, d/c activities limitations if indicated, with other d/c instructions as indicated by MD - patient able to verbalize understanding, all questions fully answered.   Patient instructed to return to ED, call 911, or call MD for any changes in condition.   Patient to be escorted via WC, and D/C home via private auto.

## 2020-05-31 NOTE — Anesthesia Postprocedure Evaluation (Signed)
Anesthesia Post Note  Patient: Kylie Moyer  Procedure(s) Performed: LAPAROSCOPIC CHOLECYSTECTOMY (N/A Abdomen)     Patient location during evaluation: PACU Anesthesia Type: General Level of consciousness: awake and alert Pain management: pain level controlled Vital Signs Assessment: post-procedure vital signs reviewed and stable Respiratory status: spontaneous breathing, nonlabored ventilation, respiratory function stable and patient connected to nasal cannula oxygen Cardiovascular status: blood pressure returned to baseline and stable Postop Assessment: no apparent nausea or vomiting Anesthetic complications: no   No complications documented.  Last Vitals:  Vitals:   05/31/20 0205 05/31/20 0500  BP: 102/63 109/65  Pulse: 66 68  Resp: 17 16  Temp: 36.8 C 36.8 C  SpO2: 99% 100%    Last Pain:  Vitals:   05/31/20 0908  TempSrc:   PainSc: 6                  Isobel Eisenhuth S

## 2020-05-31 NOTE — Progress Notes (Signed)
1 Day Post-Op   Subjective/Chief Complaint: Doing fine, no issues this am, ambulating, tol diet   Objective: Vital signs in last 24 hours: Temp:  [98 F (36.7 C)-98.5 F (36.9 C)] 98.3 F (36.8 C) (06/18 0500) Pulse Rate:  [66-94] 68 (06/18 0500) Resp:  [12-19] 16 (06/18 0500) BP: (102-147)/(59-91) 109/65 (06/18 0500) SpO2:  [96 %-100 %] 100 % (06/18 0500) Weight:  [69.3 kg] 69.3 kg (06/17 1052) Last BM Date:  (PTA)  Intake/Output from previous day: 06/17 0701 - 06/18 0700 In: 3782.9 [P.O.:720; I.V.:2562.9; IV Piggyback:500] Out: 30 [Blood:30] Intake/Output this shift: No intake/output data recorded.  GI: soft approp tender incisions clean  Lab Results:  Recent Labs    05/29/20 0356  WBC 6.3  HGB 13.4  HCT 41.5  PLT 164   BMET Recent Labs    05/29/20 0356  NA 138  K 3.6  CL 105  CO2 18*  GLUCOSE 70  BUN 9  CREATININE 0.80  CALCIUM 8.9   PT/INR No results for input(s): LABPROT, INR in the last 72 hours. ABG No results for input(s): PHART, HCO3 in the last 72 hours.  Invalid input(s): PCO2, PO2  Studies/Results: DG ERCP BILIARY & PANCREATIC DUCTS  Result Date: 05/29/2020 CLINICAL DATA:  49 year old female with a history of choledocholithiasis EXAM: ERCP TECHNIQUE: Multiple spot images obtained with the fluoroscopic device and submitted for interpretation post-procedure. FLUOROSCOPY TIME:  Fluoroscopy Time:  4 minutes 43 seconds COMPARISON:  None. FINDINGS: Limited intraoperative fluoroscopic spot images during ERCP. Initial image demonstrates the endoscope projecting over the upper abdomen. Subsequently there is cannulation of the ampulla and retrograde infusion of contrast into the extrahepatic ductal system. No obvious filling defects of the CBD and the common hepatic duct Incomplete contrast opacification of the cystic duct, of uncertain significance. IMPRESSION: Limited images during ERCP demonstrates no evidence of filling defects within the common bile  duct or common hepatic duct. There is vague contrast column in the region of the cystic duct with incomplete opacification, of uncertain significance. Please refer to the dictated operative report for full details of intraoperative findings and procedure. Electronically Signed   By: Gilmer Mor D.O.   On: 05/29/2020 16:56    Anti-infectives: Anti-infectives (From admission, onward)   Start     Dose/Rate Route Frequency Ordered Stop   05/29/20 0430  cefTRIAXone (ROCEPHIN) 2 g in sodium chloride 0.9 % 100 mL IVPB  Status:  Discontinued        2 g 200 mL/hr over 30 Minutes Intravenous Every 24 hours 05/29/20 0417 05/30/20 1408   05/29/20 0430  metroNIDAZOLE (FLAGYL) IVPB 500 mg  Status:  Discontinued        500 mg 100 mL/hr over 60 Minutes Intravenous Every 8 hours 05/29/20 0417 05/30/20 1408      Assessment/Plan: POD 1 lap chole S/p ercp for choledocholithiasis -home today  Kylie Moyer 05/31/2020

## 2020-06-03 LAB — CULTURE, BLOOD (ROUTINE X 2)
Culture: NO GROWTH
Culture: NO GROWTH

## 2020-11-02 IMAGING — US US ABDOMEN LIMITED
1 series · 14 of 25 positions shown · non-contrast
Comparison: None.

CLINICAL DATA: Abdominal pain, question of choledocholithiasis

EXAM:
ULTRASOUND ABDOMEN LIMITED RIGHT UPPER QUADRANT

[Series 1: us abdomen limited ruq · 14 of 54 slices shown]
[im 1/54]
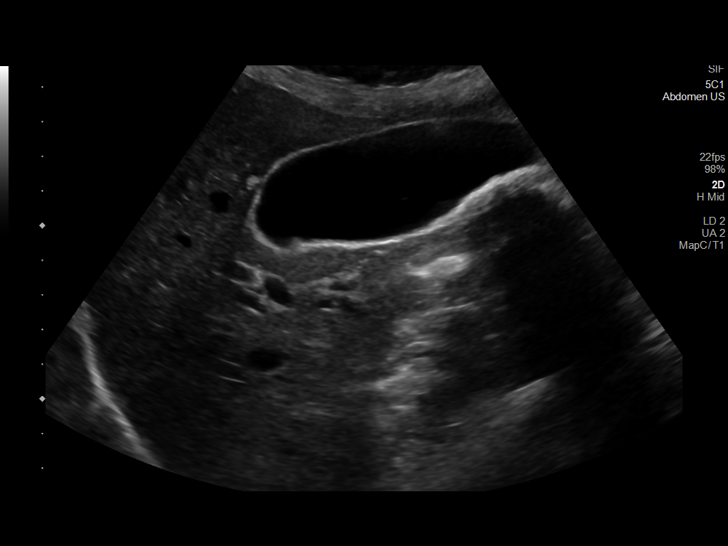
[im 5/54]
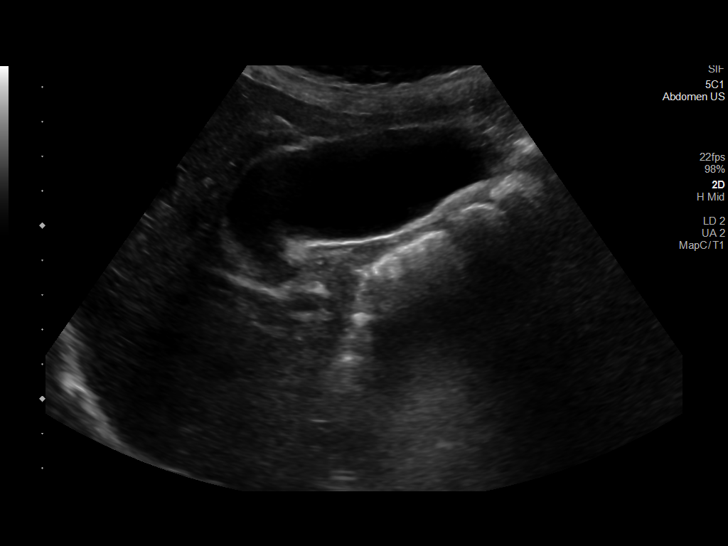
[im 9/54]
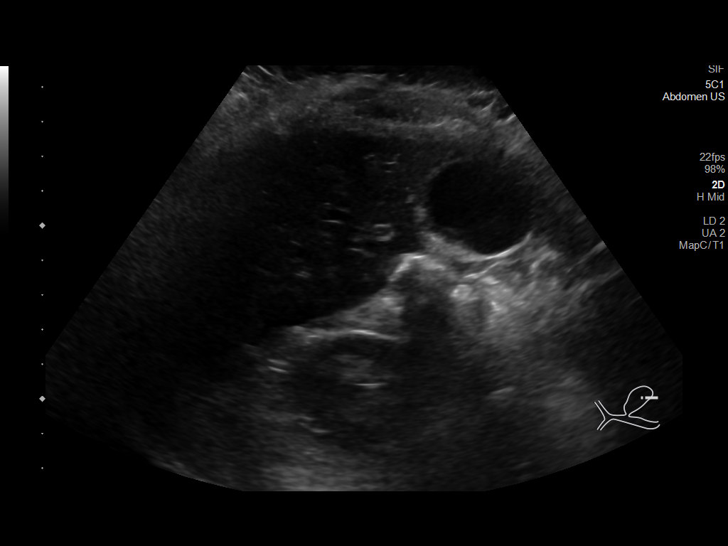
[im 14/54]
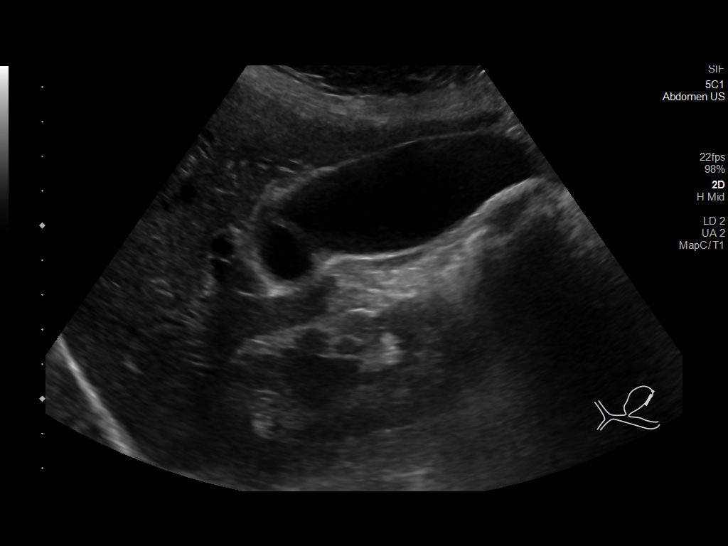
[im 18/54]
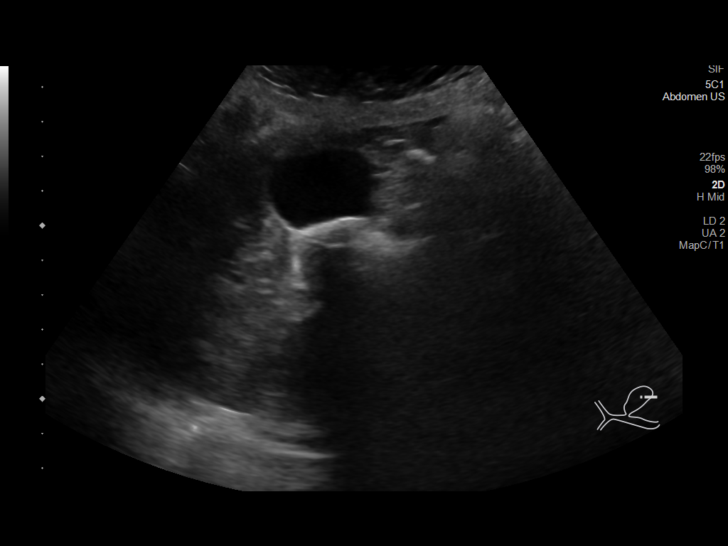
[im 20/54]
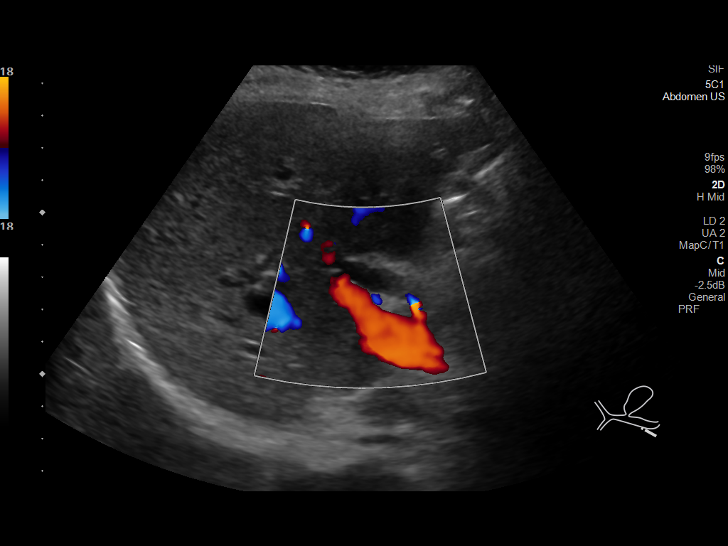
[im 25/54]
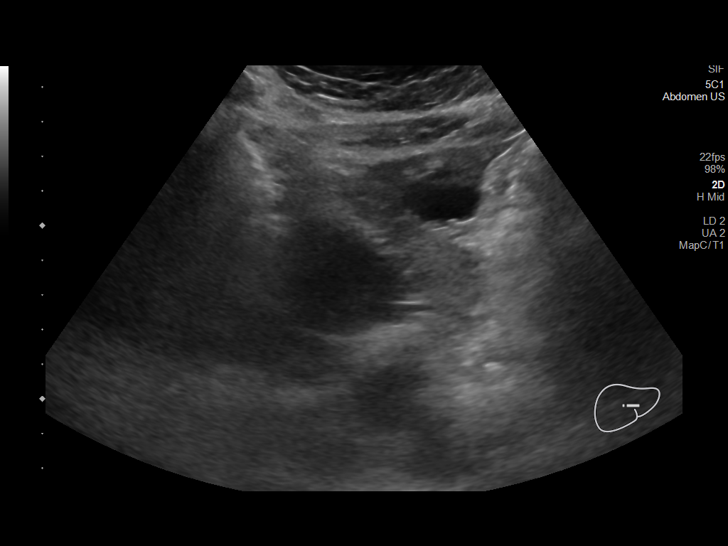
[im 29/54]
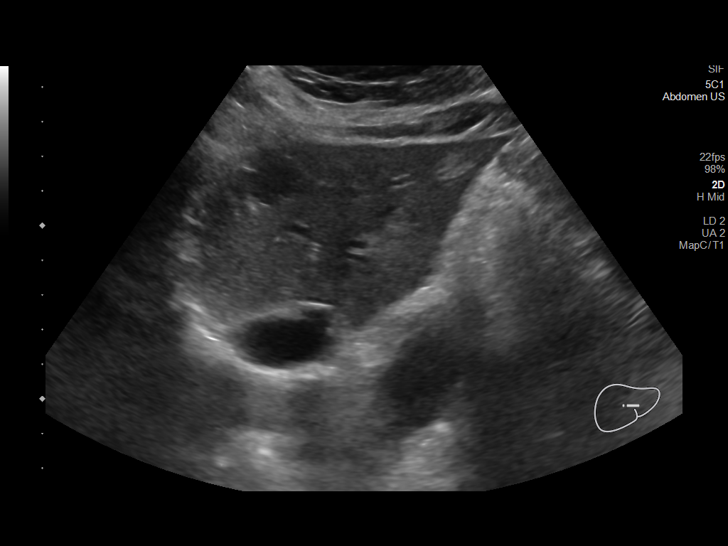
[im 34/54]
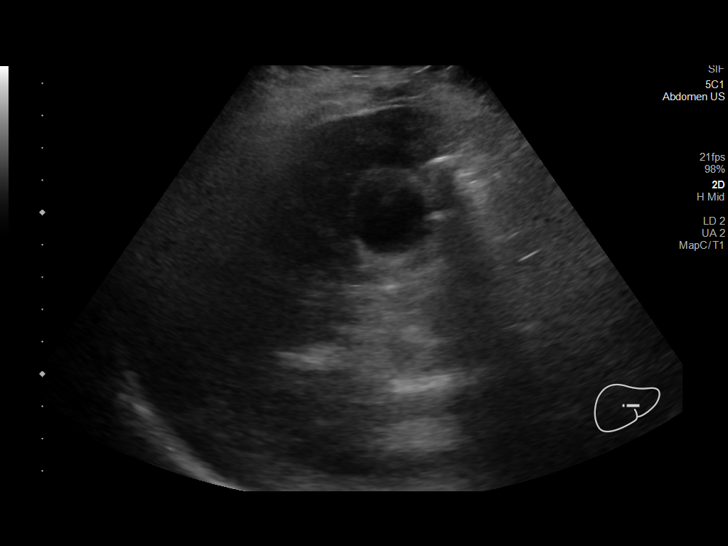
[im 36/54]
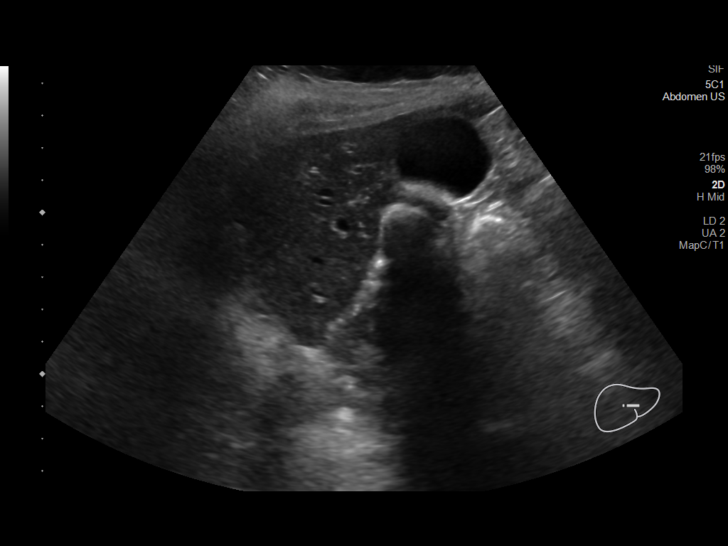
[im 40/54]
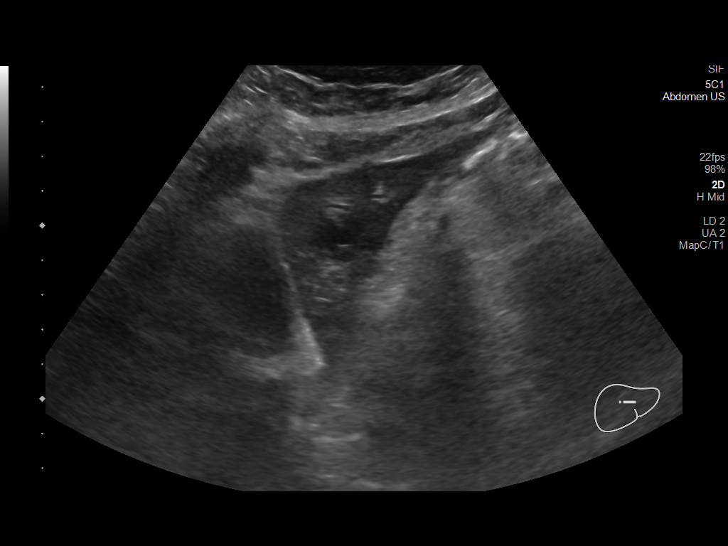
[im 45/54]
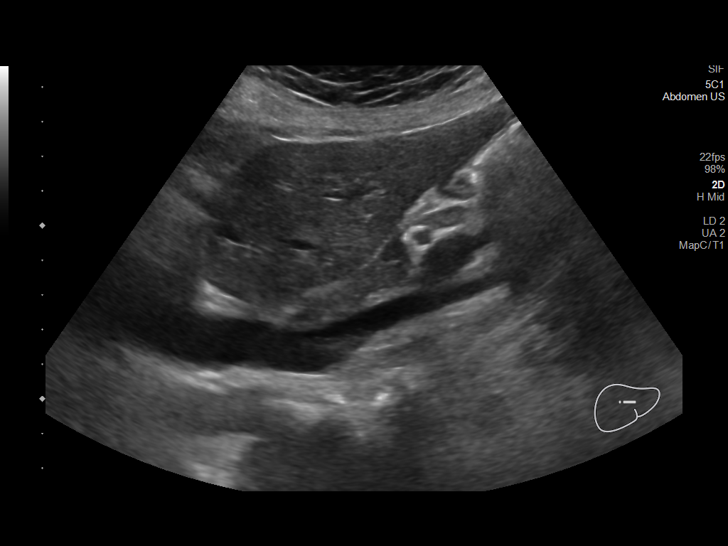
[im 49/54]
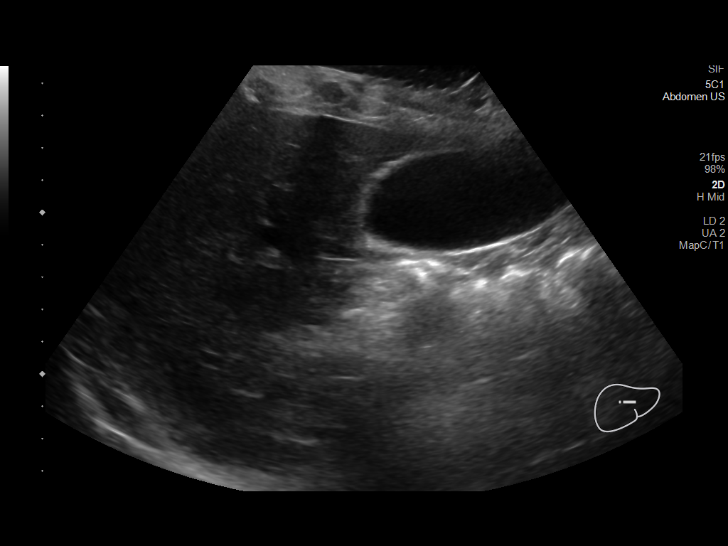
[im 54/54]
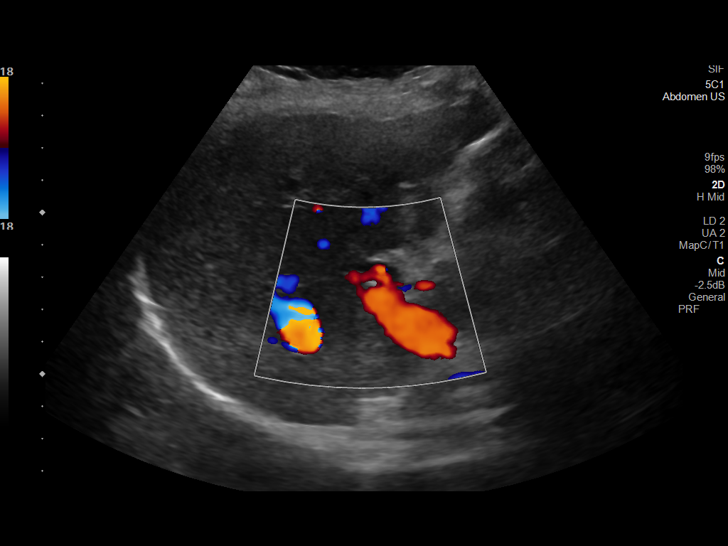

[14 of 25 positions shown; findings below may reference images not displayed]

FINDINGS: Gallbladder:

Layering hyperechoic sludge/small stones is seen. No sonographic
Murphy sign noted by sonographer.

Common bile duct:

Diameter: 7 mm

Liver:

No focal lesion identified. Within normal limits in parenchymal
echogenicity. Portal vein is patent on color Doppler imaging with
normal direction of blood flow towards the liver.

Other: None.
IMPRESSION: Cholelithiasis/sludge without evidence of acute cholecystitis.

## 2020-11-02 IMAGING — RF DG ERCP WO/W SPHINCTEROTOMY
1 series · 4 of 4 positions shown · non-contrast
Comparison: None.

CLINICAL DATA: 49-year-old female with a history of
choledocholithiasis

EXAM:
ERCP
TECHNIQUE: Multiple spot images obtained with the fluoroscopic device and
submitted for interpretation post-procedure.
FLUOROSCOPY TIME:  Fluoroscopy Time:  4 minutes 43 seconds

[Series 1: unknown protocol · 0.20mm/px · 4 of 4 slices shown]
[im 1/4]
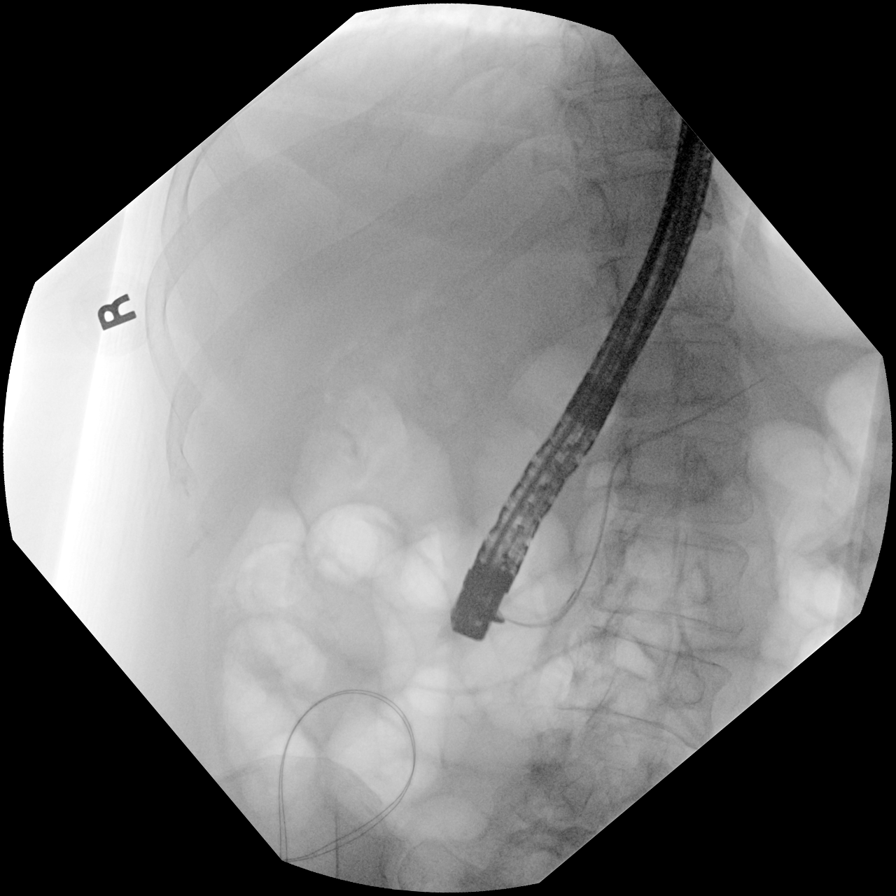
[im 2/4]
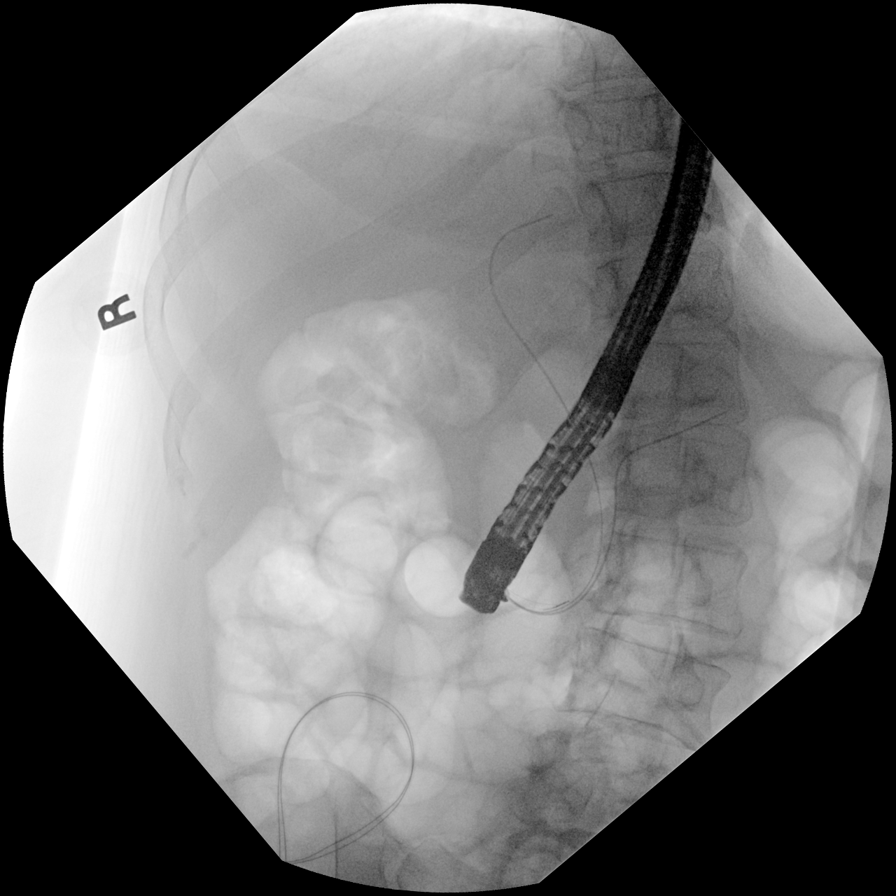
[im 3/4]
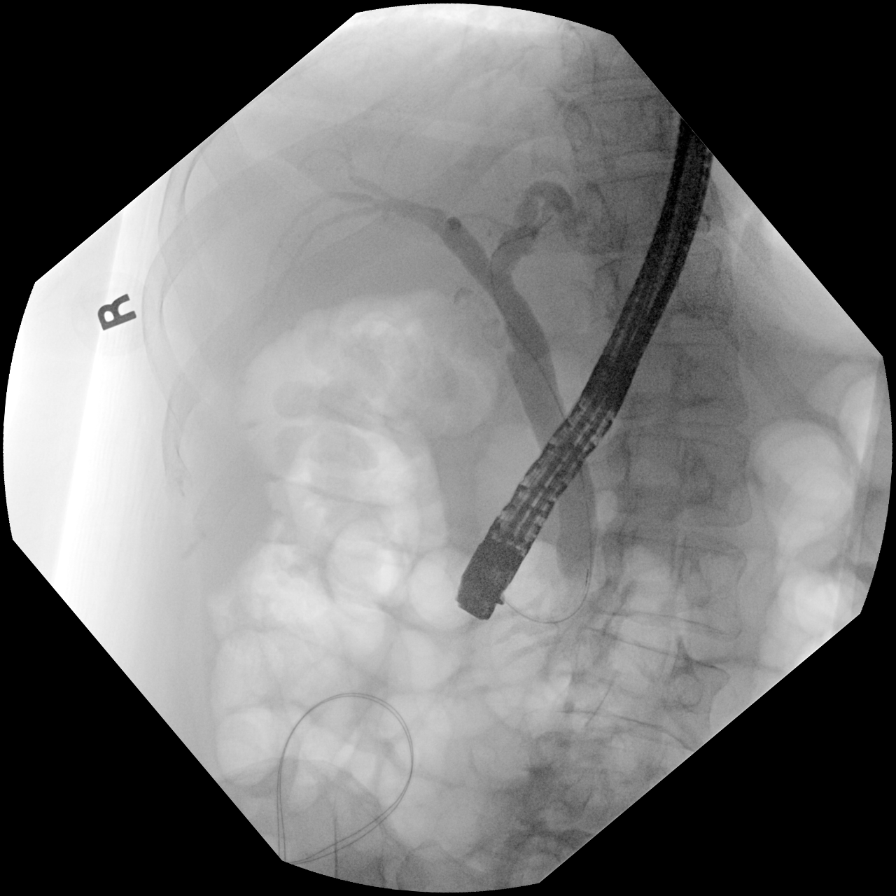
[im 4/4]
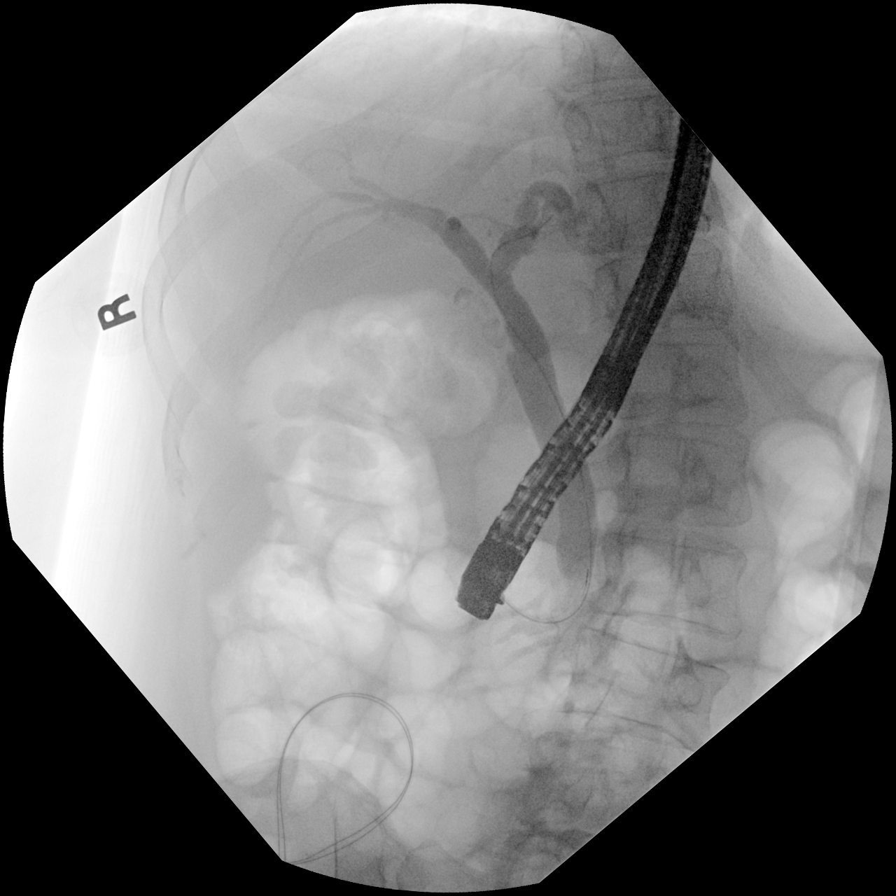

[4 of 4 positions shown; findings below may reference images not displayed]

FINDINGS: Limited intraoperative fluoroscopic spot images during ERCP.

Initial image demonstrates the endoscope projecting over the upper
abdomen.

Subsequently there is cannulation of the ampulla and retrograde
infusion of contrast into the extrahepatic ductal system.

No obvious filling defects of the CBD and the common hepatic duct

Incomplete contrast opacification of the cystic duct, of uncertain
significance.
IMPRESSION: Limited images during ERCP demonstrates no evidence of filling
defects within the common bile duct or common hepatic duct.

There is vague contrast column in the region of the cystic duct with
incomplete opacification, of uncertain significance. Please refer to
the dictated operative report for full details of intraoperative
findings and procedure.
